# Patient Record
Sex: Male | Born: 1985 | Race: Black or African American | Hispanic: No | Smoking: Current every day smoker
Health system: Southern US, Community
[De-identification: ages and names within clinical notes are randomized; demographics above are authoritative.]

## PROBLEM LIST (undated history)

## (undated) DIAGNOSIS — K219 Gastro-esophageal reflux disease without esophagitis: Secondary | ICD-10-CM

## (undated) DIAGNOSIS — L309 Dermatitis, unspecified: Secondary | ICD-10-CM

## (undated) DIAGNOSIS — M24419 Recurrent dislocation, unspecified shoulder: Secondary | ICD-10-CM

## (undated) DIAGNOSIS — S86019A Strain of unspecified Achilles tendon, initial encounter: Secondary | ICD-10-CM

## (undated) HISTORY — PX: ACHILLES TENDON REPAIR: SUR1153

## (undated) HISTORY — PX: SHOULDER SURGERY: SHX246

---

## 2010-11-08 ENCOUNTER — Emergency Department (HOSPITAL_COMMUNITY): Payer: Self-pay

## 2010-11-08 ENCOUNTER — Emergency Department (HOSPITAL_COMMUNITY)
Admission: EM | Admit: 2010-11-08 | Discharge: 2010-11-08 | Disposition: A | Discharge status: Home / Self Care | Payer: Self-pay | Attending: Emergency Medicine | Admitting: Emergency Medicine

## 2010-11-08 DIAGNOSIS — Y92009 Unspecified place in unspecified non-institutional (private) residence as the place of occurrence of the external cause: Secondary | ICD-10-CM | POA: Insufficient documentation

## 2010-11-08 DIAGNOSIS — M24419 Recurrent dislocation, unspecified shoulder: Secondary | ICD-10-CM | POA: Insufficient documentation

## 2010-11-08 DIAGNOSIS — S46909A Unspecified injury of unspecified muscle, fascia and tendon at shoulder and upper arm level, unspecified arm, initial encounter: Secondary | ICD-10-CM | POA: Insufficient documentation

## 2010-11-08 DIAGNOSIS — X500XXA Overexertion from strenuous movement or load, initial encounter: Secondary | ICD-10-CM | POA: Insufficient documentation

## 2010-11-08 DIAGNOSIS — S4980XA Other specified injuries of shoulder and upper arm, unspecified arm, initial encounter: Secondary | ICD-10-CM | POA: Insufficient documentation

## 2010-11-08 DIAGNOSIS — M25519 Pain in unspecified shoulder: Secondary | ICD-10-CM | POA: Insufficient documentation

## 2010-12-22 ENCOUNTER — Emergency Department (INDEPENDENT_AMBULATORY_CARE_PROVIDER_SITE_OTHER): Payer: Self-pay

## 2010-12-22 ENCOUNTER — Emergency Department (HOSPITAL_COMMUNITY)
Admission: EM | Admit: 2010-12-22 | Discharge: 2010-12-22 | Disposition: A | Discharge status: Home / Self Care | Payer: Self-pay | Source: Home / Self Care | Attending: Emergency Medicine | Admitting: Emergency Medicine

## 2010-12-22 ENCOUNTER — Encounter: Payer: Self-pay | Admitting: Emergency Medicine

## 2010-12-22 DIAGNOSIS — S43006A Unspecified dislocation of unspecified shoulder joint, initial encounter: Secondary | ICD-10-CM

## 2010-12-22 HISTORY — DX: Recurrent dislocation, unspecified shoulder: M24.419

## 2010-12-22 MED ORDER — OXYCODONE-ACETAMINOPHEN 5-325 MG PO TABS: ORAL_TABLET | ORAL | Status: AC

## 2010-12-22 NOTE — ED Notes (Signed)
Shoulder "dislocation" history of the same.  Reports he was reaching down yesterday and "it just happened."   Left shoulder pain, pain across the top of shoulder, axilla, and top of arm, has the "burn" feeling

## 2010-12-22 NOTE — ED Provider Notes (Signed)
History     CSN: [id number] Arrival date & time: 12/22/2010  5:06 PM   First MD Initiated Contact with Patient 12/22/10 1646      Chief Complaint  Patient presents with  . Shoulder Pain    (Consider location/radiation/quality/duration/timing/severity/associated sxs/prior treatment) HPI Comments: Alexis Herrera has had multiple shoulder dislocations and subluxations in the past. He had surgery on the right shoulder in 2003 and the left in 2008. Both of these were in [facility] by a doctor [employer]. Despite his surgery, he continues to have dislocations, mostly of his left shoulder and a left shoulder dislocation yesterday while he was just reaching. He noted deformity and pain. He was able to relocate it himself by dangling it down. The shoulder doesn't quite feel the same since then and has been burning down the arm but no numbness, tingling, or weakness. He now has limited range of motion with pain.  Patient is a 25 y.o. male presenting with shoulder pain.  Shoulder Pain    Past Medical History  Diagnosis Date  . Shoulder dislocation, recurrent     Past Surgical History  Procedure Date  . Shoulder surgery     multiple surgeries on the left and surgery on the right     History reviewed. No pertinent family history.  History  Substance Use Topics  . Smoking status: Current Everyday Smoker  . Smokeless tobacco: Not on file  . Alcohol Use: Yes      Review of Systems  Musculoskeletal: Positive for arthralgias. Negative for myalgias, back pain, joint swelling and gait problem.  Skin: Negative for rash and wound.  Neurological: Negative for weakness and numbness.    Allergies  Review of patient's allergies indicates no known allergies.  Home Medications   Current Outpatient Rx  Name Route Sig Dispense Refill  . ACETAMINOPHEN 325 MG PO TABS Oral Take 650 mg by mouth every 6 (six) hours as needed.      . OXYCODONE-ACETAMINOPHEN 5-325 MG PO TABS  1 to 2 tablets every 6  hours as needed for pain. 20 tablet 0    BP 152/92  Pulse 83  Temp(Src) 97.6 F (36.4 C) (Oral)  Resp 17  SpO2 98%  Physical Exam  Nursing note and vitals reviewed. Constitutional: He is oriented to person, place, and time. He appears well-developed and well-nourished. No distress.  Musculoskeletal: Normal range of motion. He exhibits no edema and no tenderness.       Exam of the shoulder reveals no obvious deformity. There was no pain to palpation. His range of motion was limited with pain. Pulses were full. Muscle strength, sensation, and DTRs were normal.  Neurological: He is alert and oriented to person, place, and time. He has normal strength and normal reflexes. He displays no atrophy. No sensory deficit. He exhibits normal muscle tone.  Skin: Skin is warm and dry. No rash noted. He is not diaphoretic.    ED Course  Procedures (including critical care time)  Labs Reviewed - No data to display Dg Shoulder Left  12/22/2010  *RADIOLOGY REPORT*  Clinical Data: Left shoulder dislocation, pain.  LEFT SHOULDER - 2+ VIEW  Comparison: 11/08/2010  Findings: Postoperative changes are noted with surgical anchors in the region of the glenoid.  There is is a bed and anchor which appears free in the joint space.  No fracture, subluxation or dislocation currently.  IMPRESSION: Single surgical anchor no longer within the bone, likely within the joint space.  Although more difficult to determine  on prior study, this is likely unchanged.  Original Report Authenticated By: Alexis Herrera, M.D.     1. Shoulder dislocation       MDM  It appears that he dislocated his shoulder yesterday and relocated it himself, however the x-ray shows one of the surgical anchors has come loose and is now in the joint space itself which is probably the cause of his current symptoms. Reviewing his x-rays from his emergency room visit of 2 months ago the surgical anchor was in its proper place. Right now he needs to  immobilize it and he has an immobilizer at home which he will wear. I gave him the name of Dr. Dorita Herrera to followup with in a couple days. He'll probably need to have the surgical anchor removed surgically.        Roque Lias, MD 12/22/10 [phone]

## 2010-12-22 NOTE — ED Notes (Signed)
Notified patient xray results are back .  Physician with another patient, but will notify him results back and send him in to patient.  Declined needs.  Continues to be bundled in blanket given to him when triaged.  Significant other in treatment room, denies needs.

## 2011-03-29 ENCOUNTER — Emergency Department (HOSPITAL_COMMUNITY)
Admission: EM | Admit: 2011-03-29 | Discharge: 2011-03-29 | Disposition: A | Discharge status: Home / Self Care | Payer: Self-pay | Attending: Emergency Medicine | Admitting: Emergency Medicine

## 2011-03-29 ENCOUNTER — Emergency Department (HOSPITAL_COMMUNITY): Payer: Self-pay

## 2011-03-29 ENCOUNTER — Encounter (HOSPITAL_COMMUNITY): Payer: Self-pay | Admitting: *Deleted

## 2011-03-29 DIAGNOSIS — S43005A Unspecified dislocation of left shoulder joint, initial encounter: Secondary | ICD-10-CM

## 2011-03-29 DIAGNOSIS — S43006A Unspecified dislocation of unspecified shoulder joint, initial encounter: Secondary | ICD-10-CM | POA: Insufficient documentation

## 2011-03-29 DIAGNOSIS — W19XXXA Unspecified fall, initial encounter: Secondary | ICD-10-CM | POA: Insufficient documentation

## 2011-03-29 MED ORDER — OXYCODONE-ACETAMINOPHEN 5-325 MG PO TABS: 1.0000 | ORAL_TABLET | ORAL | Status: AC | PRN

## 2011-03-29 MED ORDER — ETOMIDATE 2 MG/ML IV SOLN
13.5000 mg | Freq: Once | INTRAVENOUS | Status: AC
  Administered 2011-03-29: 13.5 mg via INTRAVENOUS
  Filled 2011-03-29: qty 10

## 2011-03-29 MED ORDER — HYDROMORPHONE HCL PF 1 MG/ML IJ SOLN
1.0000 mg | Freq: Once | INTRAMUSCULAR | Status: AC
  Administered 2011-03-29: 1 mg via INTRAVENOUS
  Filled 2011-03-29: qty 1

## 2011-03-29 MED ORDER — ONDANSETRON HCL 4 MG/2ML IJ SOLN
4.0000 mg | Freq: Once | INTRAMUSCULAR | Status: AC
  Administered 2011-03-29: 4 mg via INTRAVENOUS
  Filled 2011-03-29: qty 2

## 2011-03-29 MED ORDER — OXYCODONE-ACETAMINOPHEN 5-325 MG PO TABS
2.0000 | ORAL_TABLET | Freq: Once | ORAL | Status: AC
  Administered 2011-03-29: 2 via ORAL
  Filled 2011-03-29: qty 2

## 2011-03-29 MED ORDER — ETOMIDATE 2 MG/ML IV SOLN
INTRAVENOUS | Status: AC | PRN
  Administered 2011-03-29: 13.5 mg via INTRAVENOUS

## 2011-03-29 MED ORDER — SODIUM CHLORIDE 0.9 % IV SOLN
Freq: Once | INTRAVENOUS | Status: AC
  Administered 2011-03-29: 03:00:00 via INTRAVENOUS

## 2011-03-29 NOTE — ED Notes (Signed)
The pt fell earlier tonight and injured his lt shoulder.  He says its dislocated and has been in the past

## 2011-03-29 NOTE — ED Provider Notes (Signed)
History     CSN: [id number]  Arrival date & time 03/29/11  0135   First MD Initiated Contact with Patient 03/29/11 0234      Chief Complaint  Patient presents with  . Left shoulder dislocation     (Consider location/radiation/quality/duration/timing/severity/associated sxs/prior treatment) HPI This is a 26 year old black male with a history of shoulder dislocations. He states he fell about an hour and half ago and dislocated his left shoulder. There is moderate to severe pain associated with this. The pain is worse with attempted movement of the left shoulder. His left upper extremity is otherwise neurovascularly intact. He denies other injury.  Past Medical History  Diagnosis Date  . Shoulder dislocation, recurrent     Past Surgical History  Procedure Date  . Shoulder surgery     multiple surgeries on the left and surgery on the right     History reviewed. No pertinent family history.  History  Substance Use Topics  . Smoking status: Current Everyday Smoker  . Smokeless tobacco: Not on file  . Alcohol Use: Yes      Review of Systems  All other systems reviewed and are negative.    Allergies  Review of patient's allergies indicates no known allergies.  Home Medications  No current outpatient prescriptions on file.  BP 135/84  Pulse 62  Temp(Src) 97.5 F (36.4 C) (Oral)  Resp 15  SpO2 100%  Physical Exam General: Well-developed, well-nourished male in no acute distress; appearance consistent with age of record HENT: normocephalic, atraumatic Eyes: Normal per Neck: supple Heart: regular rate and rhythm Lungs: clear to auscultation bilaterally Abdomen: soft; nondistended Extremities: Anterior dislocation deformity of left shoulder; decreased range of motion left shoulder due to pain; left upper extremity distally neurovascularly intact with intact tendon function Neurologic: Awake, alert and oriented; motor function intact in all extremities and  symmetric; no facial droop Skin: Warm and dry     ED Course  Procedural sedation Date/Time: 03/29/2011 3:04 AM Performed by: Hanley Seamen Authorized by: Hanley Seamen Consent: Verbal consent obtained. Written consent obtained. Risks and benefits: risks, benefits and alternatives were discussed Consent given by: patient Patient identity confirmed: verbally with patient Time out: Immediately prior to procedure a "time out" was called to verify the correct patient, procedure, equipment, support staff and site/side marked as required. Patient sedated: yes Sedatives: etomidate Analgesia: hydromorphone Sedation start date/time: 03/29/2011 3:04 AM Sedation end date/time: 03/29/2011 4:03 AM Vitals: Vital signs were monitored during sedation. Patient tolerance: Patient tolerated the procedure well with no immediate complications.  Reduction of dislocation Date/Time: 03/29/2011 3:06 AM Performed by: Hanley Seamen Authorized by: Hanley Seamen Consent: Verbal consent obtained. Written consent obtained. Risks and benefits: risks, benefits and alternatives were discussed Consent given by: patient Patient identity confirmed: verbally with patient Time out: Immediately prior to procedure a "time out" was called to verify the correct patient, procedure, equipment, support staff and site/side marked as required. Patient sedated: yes Patient tolerance: Patient tolerated the procedure well with no immediate complications. Comments: The shoulder reduced using mild traction countertraction. The left shoulder was then placed in a sling.   (including critical care time)      MDM   Nursing notes and vitals signs, including pulse oximetry, reviewed.  Summary of this visit's results, reviewed by myself:  Imaging Studies: Dg Shoulder Left  03/29/2011  *RADIOLOGY REPORT*  Clinical Data: Status post fall; dislocated left shoulder.  LEFT SHOULDER - 2+ VIEW  Comparison: Left shoulder  radiographs  performed 12/22/2010  Findings: There is anterior-inferior dislocation of the left humeral head, with a prominent associated Hill-Sachs lesion. Apparent irregularity along the inferior glenoid appears chronic in nature.  Postoperative changes are noted about the left shoulder.  No additional fractures are seen.  The visualized portions of the lungs are clear.  No significant soft tissue abnormalities are characterized on radiograph.  IMPRESSION: Anterior-inferior dislocation of the left humeral head, with a prominent associated Hill-Sachs lesion.  Chronic irregularity again noted along the inferior glenoid.  Original Report Authenticated By: Tonia Ghent, M.D.   Dg Shoulder Left Port  03/29/2011  *RADIOLOGY REPORT*  Clinical Data: Status post reduction of left shoulder dislocation.  PORTABLE LEFT SHOULDER - 2+ VIEW  Comparison: Left shoulder radiographs performed earlier today at 02:26 a.m.  Findings: There has been successful reduction of the left humeral head dislocation.  The previously noted Hill-Sachs lesion is less well characterized.  Postoperative change is noted about the left shoulder joint.  The acromioclavicular joint is unremarkable in appearance.  No significant soft tissue abnormalities are seen. The visualized portions of the left lung are clear.  IMPRESSION: Successful reduction of left humeral head dislocation; no new fractures seen.  Original Report Authenticated By: Tonia Ghent, M.D.   4:03 AM Patient is returned to pre-sedation baseline. Left upper extremity remains neurovascularly intact.        Hanley Seamen, MD 03/29/11 [phone]

## 2011-03-29 NOTE — Discharge Instructions (Signed)

## 2011-03-29 NOTE — ED Notes (Signed)
Patient back to baseline.  Sedation has worn off, and the patient has a GCS of 15.

## 2011-03-29 NOTE — ED Notes (Signed)
Patient is AOx4 and comfortable with his discharge instructions.  His significant other is driving him home.

## 2011-04-29 ENCOUNTER — Other Ambulatory Visit: Payer: Self-pay

## 2011-04-29 ENCOUNTER — Other Ambulatory Visit: Payer: Self-pay | Admitting: Orthopedic Surgery

## 2011-04-29 ENCOUNTER — Ambulatory Visit
Admission: RE | Admit: 2011-04-29 | Discharge: 2011-04-29 | Disposition: A | Discharge status: Home / Self Care | Payer: BC Managed Care – PPO | Source: Ambulatory Visit | Attending: Orthopedic Surgery | Admitting: Orthopedic Surgery

## 2011-04-29 DIAGNOSIS — Z01818 Encounter for other preprocedural examination: Secondary | ICD-10-CM

## 2011-09-23 ENCOUNTER — Emergency Department (HOSPITAL_COMMUNITY): Payer: No Typology Code available for payment source

## 2011-09-23 ENCOUNTER — Emergency Department (HOSPITAL_COMMUNITY)
Admission: EM | Admit: 2011-09-23 | Discharge: 2011-09-23 | Disposition: A | Discharge status: Home / Self Care | Payer: No Typology Code available for payment source | Attending: Emergency Medicine | Admitting: Emergency Medicine

## 2011-09-23 DIAGNOSIS — Y92410 Unspecified street and highway as the place of occurrence of the external cause: Secondary | ICD-10-CM | POA: Insufficient documentation

## 2011-09-23 DIAGNOSIS — S139XXA Sprain of joints and ligaments of unspecified parts of neck, initial encounter: Secondary | ICD-10-CM | POA: Insufficient documentation

## 2011-09-23 DIAGNOSIS — M542 Cervicalgia: Secondary | ICD-10-CM | POA: Insufficient documentation

## 2011-09-23 DIAGNOSIS — S161XXA Strain of muscle, fascia and tendon at neck level, initial encounter: Secondary | ICD-10-CM

## 2011-09-23 DIAGNOSIS — R51 Headache: Secondary | ICD-10-CM | POA: Insufficient documentation

## 2011-09-23 DIAGNOSIS — F17200 Nicotine dependence, unspecified, uncomplicated: Secondary | ICD-10-CM | POA: Insufficient documentation

## 2011-09-23 MED ORDER — TRAMADOL HCL 50 MG PO TABS: 50.0000 mg | ORAL_TABLET | Freq: Four times a day (QID) | ORAL | Status: AC | PRN
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
## 2011-09-23 NOTE — ED Notes (Signed)
Patient transported to X-ray 

## 2014-05-20 ENCOUNTER — Emergency Department (HOSPITAL_COMMUNITY): Payer: Self-pay

## 2014-05-20 ENCOUNTER — Encounter (HOSPITAL_COMMUNITY): Payer: Self-pay | Admitting: Emergency Medicine

## 2014-05-20 ENCOUNTER — Emergency Department (HOSPITAL_COMMUNITY)
Admission: EM | Admit: 2014-05-20 | Discharge: 2014-05-20 | Disposition: A | Discharge status: Home / Self Care | Payer: Self-pay | Attending: Emergency Medicine | Admitting: Emergency Medicine

## 2014-05-20 DIAGNOSIS — Y9367 Activity, basketball: Secondary | ICD-10-CM | POA: Insufficient documentation

## 2014-05-20 DIAGNOSIS — S86002A Unspecified injury of left Achilles tendon, initial encounter: Secondary | ICD-10-CM | POA: Insufficient documentation

## 2014-05-20 DIAGNOSIS — M7660 Achilles tendinitis, unspecified leg: Secondary | ICD-10-CM

## 2014-05-20 DIAGNOSIS — Y9289 Other specified places as the place of occurrence of the external cause: Secondary | ICD-10-CM | POA: Insufficient documentation

## 2014-05-20 DIAGNOSIS — Y998 Other external cause status: Secondary | ICD-10-CM | POA: Insufficient documentation

## 2014-05-20 DIAGNOSIS — X58XXXA Exposure to other specified factors, initial encounter: Secondary | ICD-10-CM | POA: Insufficient documentation

## 2014-05-20 DIAGNOSIS — Z720 Tobacco use: Secondary | ICD-10-CM | POA: Insufficient documentation

## 2014-05-20 DIAGNOSIS — S86012A Strain of left Achilles tendon, initial encounter: Secondary | ICD-10-CM

## 2014-05-20 MED ORDER — OXYCODONE-ACETAMINOPHEN 5-325 MG PO TABS
2.0000 | ORAL_TABLET | Freq: Once | ORAL | Status: AC
  Administered 2014-05-20: 2 via ORAL
  Filled 2014-05-20: qty 2

## 2014-05-20 MED ORDER — OXYCODONE-ACETAMINOPHEN 5-325 MG PO TABS: 1.0000 | ORAL_TABLET | Freq: Four times a day (QID) | ORAL | Status: DC | PRN

## 2014-05-20 MED ORDER — NAPROXEN 500 MG PO TABS: 500.0000 mg | ORAL_TABLET | Freq: Two times a day (BID) | ORAL | Status: AC

## 2014-05-20 NOTE — Consult Note (Signed)
PROVIDER NOT IN SYSTEM Chief Complaint: Left achilles tendon rupture History: Alexis Herrera is a 29 y.o. male who presents to the Emergency Department complaining of constant, worsening left lower leg pain that has been present for a few hours since pt was injured while playing basketball. Pt reports a sharp, shooting pain around his achilles area that has been present since pt heart a pop while playing basketball. Pt states that he cannot walk on his foot. Pt reports he can move his toes but cannot move his ankle. Pt reports that he tore his right achilles a few years ago, and states that this pain is identical to that pain.  Past Medical History  Diagnosis Date  . Shoulder dislocation, recurrent     No Known Allergies  No current facility-administered medications on file prior to encounter.   No current outpatient prescriptions on file prior to encounter.    Physical Exam: Filed Vitals:   05/20/14 1609  BP: 143/85  Pulse: 88  Temp: 98.7 F (37.1 C)  Resp: 18  A+O X3 NVI Intact DP/PT pulses Minor bruising over achilles tendon Positive thompson sign  Palpable defect in achillis tendon - mid substance No laceration noted Compartments soft/nt No right LE complaints No SOB/CP Abd soft/nt No knee swelling or pain Sensation to LT intact, moving all toes No active plantar flexion noted   Image: No results found.  A/P:  Patient s/p basketball injury to lef LE.  Felt a pop while playing.  Had similar event on the right side.  Patients clinical exam and MRI consistent with acute achilles tendon rupture.   Plan on splint, pain medication and f/u with my partner Dr Shelle IronBeane Will most likely require surgery for repair. Ok for d/c to home from ER tonight. Splint per ortho tech Crutches NWB on L LE F/u in AM with Dr Shelle IronBeane.

## 2014-05-20 NOTE — ED Notes (Signed)
Per EMS pt felt "pop" in left leg while shooting a basketball at a hoop today, pt states pain feels identical to when he tore his achilles tendon on his right side years ago. No obvious deformity.

## 2014-05-20 NOTE — ED Provider Notes (Signed)
CSN: [id number]     Arrival date & Herrera 05/20/14  1540 History  This chart was scribed for non-physician practitioner Santiago GladHeather Mcclain Shall, PA-C working with Arby BarretteMarcy Pfeiffer, MD by Murriel HopperAlec Bankhead, ED Scribe. This patient was seen in room WTR8/WTR8 Alexis the patient's care was started at 4:49 PM.    Chief Complaint  Patient presents with  . Leg Pain     The history is provided by the patient. No language interpreter was used.    HPI Comments: Alexis Herrera is a 29 y.o. male who presents to the Emergency Department complaining of constant, worsening left lower leg pain over the Achilles tendon that has been present for a few hours since pt was injured while playing basketball. Pt reports a sharp, shooting pain around his achilles area that has been present since pt heart a pop while playing basketball. Pt states that Alexis cannot walk on his foot. Pt reports Alexis can move his toes but cannot move his ankle. Pt reports that Alexis tore his right achilles a few years ago, Alexis states that this pain is identical to that pain.  Alexis has his right Achilles [place] in [place].  Alexis denies numbness or tingling of the foot.  Alexis has not taken anything for pain prior to arrival.   Past Medical History  Diagnosis Date  . Shoulder dislocation, recurrent    Past Surgical History  Procedure Laterality Date  . Shoulder surgery      multiple surgeries on the left Alexis surgery on the right    No family history on file. History  Substance Use Topics  . Smoking status: Current Every Day Smoker -- 1.00 packs/day    Types: Cigarettes  . Smokeless tobacco: Not on file  . Alcohol Use: Yes    Review of Systems  Musculoskeletal: Positive for myalgias Alexis gait problem.  Neurological: Negative for numbness.      Allergies  Review of patient's allergies indicates no known allergies.  Home Medications   Prior to Admission medications   Not on File   BP 143/85 mmHg  Pulse 88  Temp(Src) 98.7 F (37.1 C) (Oral)  Resp  18  Ht 5\' 10"  (1.778 m)  Wt 205 lb (92.987 kg)  BMI 29.41 kg/m2  SpO2 99% Physical Exam  Constitutional: Alexis Herrera, Alexis Herrera, Alexis Herrera. Alexis appears well-developed Alexis well-nourished.  HENT:  Head: Normocephalic Alexis atraumatic.  Cardiovascular: Normal rate, regular rhythm Alexis normal heart sounds.   Pulses:      Dorsalis pedis pulses are 2+ on the right side, Alexis 2+ on the left side.  Pulmonary/Chest: Effort normal Alexis breath sounds normal.  Abdominal: Alexis exhibits no distension.  Musculoskeletal:       Left knee: Alexis exhibits normal range of motion.  Positive thompson's sign on left leg Patient unable to dorsiflex the left foot Palpable deformity of the left Achilles   Neurological: Alexis is alert Alexis oriented to Herrera, Alexis Herrera, Alexis Herrera.  Distal sensation of the left foot intact  Skin: Skin is warm Alexis dry.  Psychiatric: Alexis has a normal mood Alexis affect.  Nursing note Alexis vitals reviewed.   ED Course  Procedures (including critical care Herrera)  DIAGNOSTIC STUDIES: Oxygen Saturation is 99% on room air, normal by my interpretation.    COORDINATION OF CARE: 4:53 PM Discussed treatment plan with pt at bedside Alexis pt agreed to plan.   Labs Review Labs Reviewed - No data to display  Imaging Review Mr Ankle Left  Wo Contrast  05/20/2014   CLINICAL DATA:  Acute onset of pain while playing basketball today. Previous tear of the right Achilles tendon.  EXAM: MRI OF THE LEFT ANKLE WITHOUT CONTRAST  TECHNIQUE: Multiplanar, multisequence MR imaging of the ankle was performed. No intravenous contrast was administered.  COMPARISON:  None.  FINDINGS: TENDONS  Peroneal: Normal.  Posteromedial: Normal.  Anterior: Normal.  Achilles: Complete disruption of the Achilles tendon 4 cm above the calcaneus. The gap in the tendon is approximately 4 cm. The distal tendon is hypertrophied Alexis degenerated. There is hemorrhage into the adjacent soft tissues.  Plantar Fascia: Normal.  LIGAMENTS  Lateral:  There is no visible intact anterior talofibular ligament or calcaneofibular ligament, probably representing chronic tears. The posterior talofibular ligament is intact.  Medial: Normal.  CARTILAGE  Ankle Joint: Normal.  Subtalar Joints/Sinus Tarsi: Normal.  Bones: No significant abnormality.  IMPRESSION: 1. 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 2. No visible anterior talofibular ligament or calcaneofibular ligament. Does the patient have ligamentous laxity laterally? This is not felt to be acute.   Electronically Signed   By: Francene BoyersJames  Maxwell M.D.   On: 05/20/2014 18:35     EKG Interpretation None     Discussed with Dr. Shon BatonBrooks with Orthopedics.  Alexis recommends getting a MRI to rule out Achilles rupture. MDM   Final diagnoses:  Achilles tendon pain   Patient presents today with an injury to the left Achilles tendon that occurred while playing basketball jpta.  Patient reports that Alexis has been unable to ambulate.  Patient with positive Thompson's sign Alexis obvious palpable deformity of the Achilles.  Alexis is neurovascularly intact.  Patient discussed with Dr. Shon BatonBrooks with Orthopedics.  Alexis recommended MRI.  MRI today showing complete transection of the Achilles Tendon.  Patient seen by Dr. Shon BatonBrooks in the ED.  Patient given posterior splint Alexis crutches.  Patient has follow up with Orthopedics tomorrow.  Patient discharged home with pain medications.  Stable for discharge.  Return precautions given.   Santiago GladHeather Jayko Voorhees, PA-C 05/20/14 2018  Raeford RazorStephen Kohut, MD 05/22/14 [phone]

## 2014-05-20 NOTE — Discharge Instructions (Signed)
Do not put weight on left leg Crutches Pain medications per ER instructions Ice and elevate the left leg tonight Call first thing in the morning for appointment time

## 2014-05-20 NOTE — ED Notes (Signed)
Pt presents to ED via EMS C/O L foot pain. Pt states that he was playing basketball around 3pm and felt something pop. Pt reports pain as 6/10 and states that it is sharp and shooting. Also reports that pain is getting worse.

## 2014-05-27 NOTE — Progress Notes (Signed)
Please put orders in Epic surgery 05-31-14 pre op 05-30-14 Thanks

## 2014-05-28 ENCOUNTER — Ambulatory Visit: Payer: Self-pay | Admitting: Orthopedic Surgery

## 2014-05-28 NOTE — H&P (Signed)
Alexis Herrera is an 29 y.o. male.   Chief Complaint: left ankle pain HPI: The patient is a 29 year old male who presents with ankle complaints. The patient reports left ankle (achilles) symptoms including: pain which began 3 day(s) ago following a specific injury. The injury occurred while the patient was playing basketball. The patient describes their symptoms as mild (to severe). Current treatment includes posterior splint, crutches (non-weight bearing), nonsteroidal anti-inflammatory drugs (Naproxen) and opioid analgesics (Percocet). Prior to being seen today the patient was previously evaluated in the emergency room. Previous work-up for this problem has included ankle MRI. Past treatment for this problem has included posterior splint, crutches (non-weight bearing), nonsteroidal anti-inflammatory drugs (Naproxen) and opioid analgesics. He has had the other side repaired two-three years ago, was the same etiology.  Past Medical History  Diagnosis Date  . Shoulder dislocation, recurrent     Past Surgical History  Procedure Laterality Date  . Shoulder surgery      multiple surgeries on the left and surgery on the right     No family history on file. Social History:  reports that he has been smoking Cigarettes.  He has been smoking about 1.00 pack per day. He does not have any smokeless tobacco history on file. He reports that he drinks alcohol. His drug history is not on file.  Allergies: No Known Allergies   (Not in a hospital admission)  No results found for this or any previous visit (from the past 48 hour(s)). No results found.  Review of Systems  Constitutional: Negative.   HENT: Negative.   Eyes: Negative.   Respiratory: Negative.   Cardiovascular: Negative.   Gastrointestinal: Negative.   Genitourinary: Negative.   Musculoskeletal: Positive for joint pain.  Skin: Negative.   Neurological: Negative.   Psychiatric/Behavioral: Negative.     There were no vitals taken  for this visit. Physical Exam  Constitutional: He is oriented to person, place, and time. He appears well-developed and well-nourished.  HENT:  Head: Normocephalic and atraumatic.  Eyes: Conjunctivae and EOM are normal. Pupils are equal, round, and reactive to light.  Neck: Normal range of motion. Neck supple.  Cardiovascular: Normal rate and regular rhythm.   Respiratory: Effort normal and breath sounds normal.  GI: Soft. Bowel sounds are normal.  Musculoskeletal:  On exam, he has got a gap in the Achilles tendon, near the proximal aspect of the tendon. Not at the insertion. There is no evidence of DVT. He is neurovascularly intact. Knee exam and hip exam is unremarkable  Neurological: He is alert and oriented to person, place, and time. He has normal reflexes.  Skin: Skin is warm and dry.  Psychiatric: He has a normal mood and affect.    His outside MR demonstrates complete tear of the Achilles tendon, 4 cm above the calcaneus. Gap in the tendon is approximately 4 cm.  X-rays demonstrate no fracture.  Assessment/Plan Acute left achilles tendon rupture  We discussed options conservative versus surgical and he agreed to proceeding with surgical that may require a patch graft. We discussed risks and benefits, bleeding, infection, recurrent tear, suboptimal range of motion, DVT, PE, neurologic damage and the time to union. This may take a considerable time to heal. He does not have any insurance. [facility]. In the interim, aspirin for DVT prophylaxis, tobacco cessation indicating an affect on healing.  Plan repair left achilles tendon, possible patch graft  Anarie Kalish M. PA-C for Dr.  Beane 05/28/2014, 8:05 AM    

## 2014-05-29 NOTE — Patient Instructions (Addendum)
YOUR PROCEDURE IS SCHEDULED ON :  05/31/14  REPORT TO [facility] MAIN ENTRANCE FOLLOW SIGNS TO SHORT STAY CENTER AT :  11:30 AM  CALL THIS NUMBER IF YOU HAVE PROBLEMS THE MORNING OF SURGERY [phone]  REMEMBER:  DO NOT EAT FOOD  AFTER MIDNIGHT MAY HAVE CLEAR LIQUIDS UNTIL 7:30 AM  CLEAR LIQUID DIET Foods Allowed                                                                     Foods Excluded  Coffee and tea, regular and decaf                             liquids that you cannot  Plain Jell-O in any flavor                                             see through such as: Fruit ices (not with fruit pulp)                                     milk, soups, orange juice  Iced Popsicles                                                All solid food Carbonated beverages, regular and diet                                    Cranberry, grape and apple juices Sports drinks like Gatorade Lightly seasoned clear broth or consume(fat free) Sugar, honey syrup  _____________________________________________________________________  TAKE THESE MEDICINES THE MORNING OF SURGERY:  PERCOCET IF NEEDED  YOU MAY NOT HAVE ANY METAL ON YOUR BODY INCLUDING HAIR PINS AND PIERCING'S. DO NOT WEAR JEWELRY, MAKEUP, LOTIONS, POWDERS OR PERFUMES. DO NOT WEAR NAIL POLISH. DO NOT SHAVE 48 HRS PRIOR TO SURGERY. MEN MAY SHAVE FACE AND NECK.  DO NOT BRING VALUABLES TO HOSPITAL. Alexis Herrera IS NOT RESPONSIBLE FOR VALUABLES.  CONTACTS, DENTURES OR PARTIALS MAY NOT BE WORN TO SURGERY. LEAVE SUITCASE IN CAR. CAN BE BROUGHT TO ROOM AFTER SURGERY.  PATIENTS DISCHARGED THE DAY OF SURGERY WILL NOT BE ALLOWED TO DRIVE HOME.  PLEASE READ OVER THE FOLLOWING INSTRUCTION SHEETS _________________________________________________________________________________                                          [facility] - PREPARING FOR SURGERY  Before surgery, you can play an important role.  Because skin is not  sterile, your skin needs to be as free of germs as possible.  You can reduce the number of germs on your skin by washing with CHG (chlorahexidine gluconate) soap before surgery.  CHG is  an antiseptic cleaner which kills germs and bonds with the skin to continue killing germs even after washing. Please DO NOT use if you have an allergy to CHG or antibacterial soaps.  If your skin becomes reddened/irritated stop using the CHG and inform your nurse when you arrive at Short Stay. Do not shave (including legs and underarms) for at least 48 hours prior to the first CHG shower.  You may shave your face. Please follow these instructions carefully:   1.  Shower with CHG Soap the night before surgery and the  morning of Surgery.   2.  If you choose to wash your hair, wash your hair first as usual with your  normal  Shampoo.   3.  After you shampoo, rinse your hair and body thoroughly to remove the  shampoo.                                         4.  Use CHG as you would any other liquid soap.  You can apply chg directly  to the skin and wash . Gently wash with scrungie or clean wascloth    5.  Apply the CHG Soap to your body ONLY FROM THE NECK DOWN.   Do not use on open                           Wound or open sores. Avoid contact with eyes, ears mouth and genitals (private parts).                        Genitals (private parts) with your normal soap.              6.  Wash thoroughly, paying special attention to the area where your surgery  will be performed.   7.  Thoroughly rinse your body with warm water from the neck down.   8.  DO NOT shower/wash with your normal soap after using and rinsing off  the CHG Soap .                9.  Pat yourself dry with a clean towel.             10.  Wear clean night clothes to bed after shower             11.  Place clean sheets on your bed the night of your first shower and do not  sleep with pets.  Day of Surgery : Do not apply any lotions/deodorants the  morning of surgery.  Please wear clean clothes to the hospital/surgery center.  FAILURE TO FOLLOW THESE INSTRUCTIONS MAY RESULT IN THE CANCELLATION OF YOUR SURGERY    PATIENT SIGNATURE_________________________________  ______________________________________________________________________     Alexis MireIncentive Spirometer  An incentive spirometer is a tool that can help keep your lungs clear and active. This tool measures how well you are filling your lungs with each breath. Taking long deep breaths may help reverse or decrease the chance of developing breathing (pulmonary) problems (especially infection) following:  A long period of time when you are unable to move or be active. BEFORE THE PROCEDURE   If the spirometer includes an indicator to show your best effort, your nurse or respiratory therapist will set it to a desired goal.  If possible, sit up straight or lean  slightly forward. Try not to slouch.  Hold the incentive spirometer in an upright position. INSTRUCTIONS FOR USE  1. Sit on the edge of your bed if possible, or sit up as far as you can in bed or on a chair. 2. Hold the incentive spirometer in an upright position. 3. Breathe out normally. 4. Place the mouthpiece in your mouth and seal your lips tightly around it. 5. Breathe in slowly and as deeply as possible, raising the piston or the ball toward the top of the column. 6. Hold your breath for 3-5 seconds or for as long as possible. Allow the piston or ball to fall to the bottom of the column. 7. Remove the mouthpiece from your mouth and breathe out normally. 8. Rest for a few seconds and repeat Steps 1 through 7 at least 10 times every 1-2 hours when you are awake. Take your time and take a few normal breaths between deep breaths. 9. The spirometer may include an indicator to show your best effort. Use the indicator as a goal to work toward during each repetition. 10. After each set of 10 deep breaths, practice coughing to  be sure your lungs are clear. If you have an incision (the cut made at the time of surgery), support your incision when coughing by placing a pillow or rolled up towels firmly against it. Once you are able to get out of bed, walk around indoors and cough well. You may stop using the incentive spirometer when instructed by your caregiver.  RISKS AND COMPLICATIONS  Take your time so you do not get dizzy or light-headed.  If you are in pain, you may need to take or ask for pain medication before doing incentive spirometry. It is harder to take a deep breath if you are having pain. AFTER USE  Rest and breathe slowly and easily.  It can be helpful to keep track of a log of your progress. Your caregiver can provide you with a simple table to help with this. If you are using the spirometer at home, follow these instructions: SEEK MEDICAL CARE IF:   You are having difficultly using the spirometer.  You have trouble using the spirometer as often as instructed.  Your pain medication is not giving enough relief while using the spirometer.  You develop fever of 100.5 F (38.1 C) or higher. SEEK IMMEDIATE MEDICAL CARE IF:   You cough up bloody sputum that had not been present before.  You develop fever of 102 F (38.9 C) or greater.  You develop worsening pain at or near the incision site. MAKE SURE YOU:   Understand these instructions.  Will watch your condition.  Will get help right away if you are not doing well or get worse. Document Released: 05/10/2006 Document Revised: 03/22/2011 Document Reviewed: 07/11/2006 [facility] Patient Information 2014 [place], [place].   ________________________________________________________________________

## 2014-05-30 ENCOUNTER — Encounter (HOSPITAL_COMMUNITY)
Admission: RE | Admit: 2014-05-30 | Discharge: 2014-05-30 | Disposition: A | Discharge status: Home / Self Care | Payer: Self-pay | Source: Ambulatory Visit | Attending: Specialist | Admitting: Specialist

## 2014-05-30 ENCOUNTER — Encounter (HOSPITAL_COMMUNITY): Payer: Self-pay

## 2014-05-30 DIAGNOSIS — Z01812 Encounter for preprocedural laboratory examination: Secondary | ICD-10-CM | POA: Insufficient documentation

## 2014-05-30 HISTORY — DX: Strain of unspecified achilles tendon, initial encounter: S86.019A

## 2014-05-30 HISTORY — DX: Gastro-esophageal reflux disease without esophagitis: K21.9

## 2014-05-30 HISTORY — DX: Dermatitis, unspecified: L30.9

## 2014-05-30 LAB — CBC
HCT: 46.1 % (ref 39.0–52.0)
HEMOGLOBIN: 15.7 g/dL (ref 13.0–17.0)
MCH: 30.2 pg (ref 26.0–34.0)
MCHC: 34.1 g/dL (ref 30.0–36.0)
MCV: 88.7 fL (ref 78.0–100.0)
Platelets: 316 10*3/uL (ref 150–400)
RBC: 5.2 MIL/uL (ref 4.22–5.81)
RDW: 13.1 % (ref 11.5–15.5)
WBC: 10 10*3/uL (ref 4.0–10.5)

## 2014-05-31 ENCOUNTER — Encounter (HOSPITAL_COMMUNITY): Payer: Self-pay | Admitting: *Deleted

## 2014-05-31 ENCOUNTER — Ambulatory Visit (HOSPITAL_COMMUNITY): Payer: Self-pay | Admitting: Anesthesiology

## 2014-05-31 ENCOUNTER — Encounter (HOSPITAL_COMMUNITY)
Admission: RE | Disposition: A | Discharge status: Home / Self Care | Payer: Self-pay | Source: Ambulatory Visit | Attending: Specialist

## 2014-05-31 ENCOUNTER — Ambulatory Visit (HOSPITAL_COMMUNITY)
Admission: RE | Admit: 2014-05-31 | Discharge: 2014-06-01 | Disposition: A | Discharge status: Home / Self Care | Payer: Self-pay | Source: Ambulatory Visit | Attending: Specialist | Admitting: Specialist

## 2014-05-31 DIAGNOSIS — F17210 Nicotine dependence, cigarettes, uncomplicated: Secondary | ICD-10-CM | POA: Insufficient documentation

## 2014-05-31 DIAGNOSIS — Y9367 Activity, basketball: Secondary | ICD-10-CM | POA: Insufficient documentation

## 2014-05-31 DIAGNOSIS — S86019A Strain of unspecified Achilles tendon, initial encounter: Secondary | ICD-10-CM | POA: Diagnosis present

## 2014-05-31 DIAGNOSIS — K219 Gastro-esophageal reflux disease without esophagitis: Secondary | ICD-10-CM | POA: Insufficient documentation

## 2014-05-31 DIAGNOSIS — X58XXXA Exposure to other specified factors, initial encounter: Secondary | ICD-10-CM | POA: Insufficient documentation

## 2014-05-31 DIAGNOSIS — S86012A Strain of left Achilles tendon, initial encounter: Secondary | ICD-10-CM | POA: Insufficient documentation

## 2014-05-31 HISTORY — PX: ACHILLES TENDON SURGERY: SHX542

## 2014-05-31 SURGERY — REPAIR, TENDON, ACHILLES
Anesthesia: General | Site: Ankle | Laterality: Left

## 2014-05-31 MED ORDER — HYDROMORPHONE HCL 1 MG/ML IJ SOLN
1.0000 mg | INTRAMUSCULAR | Status: DC | PRN
  Administered 2014-05-31 – 2014-06-01 (×4): 1.5 mg via INTRAVENOUS
  Filled 2014-05-31 (×4): qty 2

## 2014-05-31 MED ORDER — HYDROMORPHONE HCL 1 MG/ML IJ SOLN
INTRAMUSCULAR | Status: DC | PRN
  Administered 2014-05-31: 0.5 mg via INTRAVENOUS

## 2014-05-31 MED ORDER — HYDROMORPHONE HCL 1 MG/ML IJ SOLN
INTRAMUSCULAR | Status: AC
  Filled 2014-05-31: qty 1

## 2014-05-31 MED ORDER — HYDROMORPHONE HCL 1 MG/ML IJ SOLN
0.2500 mg | INTRAMUSCULAR | Status: DC | PRN
  Administered 2014-05-31 (×3): 0.25 mg via INTRAVENOUS
  Administered 2014-05-31: 0.5 mg via INTRAVENOUS
  Administered 2014-05-31: 0.25 mg via INTRAVENOUS
  Administered 2014-05-31: 0.5 mg via INTRAVENOUS

## 2014-05-31 MED ORDER — FENTANYL CITRATE (PF) 250 MCG/5ML IJ SOLN
INTRAMUSCULAR | Status: AC
  Filled 2014-05-31: qty 5

## 2014-05-31 MED ORDER — ONDANSETRON HCL 4 MG/2ML IJ SOLN
INTRAMUSCULAR | Status: AC
  Filled 2014-05-31: qty 2

## 2014-05-31 MED ORDER — ONDANSETRON HCL 4 MG/2ML IJ SOLN
INTRAMUSCULAR | Status: DC | PRN
  Administered 2014-05-31: 4 mg via INTRAVENOUS

## 2014-05-31 MED ORDER — LORAZEPAM 1 MG PO TABS
1.0000 mg | ORAL_TABLET | Freq: Four times a day (QID) | ORAL | Status: DC | PRN
  Administered 2014-05-31: 1 mg via ORAL
  Filled 2014-05-31: qty 1

## 2014-05-31 MED ORDER — OXYCODONE HCL 5 MG PO TABS
5.0000 mg | ORAL_TABLET | ORAL | Status: DC | PRN
  Administered 2014-05-31 – 2014-06-01 (×4): 10 mg via ORAL
  Filled 2014-05-31 (×5): qty 2

## 2014-05-31 MED ORDER — ONDANSETRON HCL 4 MG/2ML IJ SOLN
4.0000 mg | Freq: Four times a day (QID) | INTRAMUSCULAR | Status: DC | PRN
  Administered 2014-05-31: 4 mg via INTRAVENOUS
  Filled 2014-05-31: qty 2

## 2014-05-31 MED ORDER — KETOROLAC TROMETHAMINE 15 MG/ML IJ SOLN
15.0000 mg | Freq: Four times a day (QID) | INTRAMUSCULAR | Status: DC
  Administered 2014-05-31: 15 mg via INTRAVENOUS

## 2014-05-31 MED ORDER — FENTANYL CITRATE (PF) 100 MCG/2ML IJ SOLN
INTRAMUSCULAR | Status: AC
  Filled 2014-05-31: qty 2

## 2014-05-31 MED ORDER — EPHEDRINE SULFATE 50 MG/ML IJ SOLN
INTRAMUSCULAR | Status: DC | PRN
  Administered 2014-05-31: 5 mg via INTRAVENOUS

## 2014-05-31 MED ORDER — POLYMYXIN B SULFATE 500000 UNITS IJ SOLR
INTRAMUSCULAR | Status: DC | PRN
  Administered 2014-05-31: 500 mL

## 2014-05-31 MED ORDER — BUPIVACAINE-EPINEPHRINE 0.25% -1:200000 IJ SOLN
INTRAMUSCULAR | Status: DC | PRN
  Administered 2014-05-31: 3 mL

## 2014-05-31 MED ORDER — KETOROLAC TROMETHAMINE 15 MG/ML IJ SOLN: 15.0000 mg | Freq: Once | INTRAMUSCULAR | Status: DC

## 2014-05-31 MED ORDER — MIDAZOLAM HCL 5 MG/5ML IJ SOLN
INTRAMUSCULAR | Status: DC | PRN
  Administered 2014-05-31 (×2): 1 mg via INTRAVENOUS

## 2014-05-31 MED ORDER — CEFAZOLIN SODIUM-DEXTROSE 2-3 GM-% IV SOLR
2.0000 g | INTRAVENOUS | Status: AC
  Administered 2014-05-31: 2 g via INTRAVENOUS

## 2014-05-31 MED ORDER — LACTATED RINGERS IV SOLN
INTRAVENOUS | Status: DC
  Administered 2014-05-31 (×3): via INTRAVENOUS

## 2014-05-31 MED ORDER — GLYCOPYRROLATE 0.2 MG/ML IJ SOLN
INTRAMUSCULAR | Status: AC
  Filled 2014-05-31: qty 4

## 2014-05-31 MED ORDER — HYDROMORPHONE HCL 2 MG/ML IJ SOLN
INTRAMUSCULAR | Status: AC
  Filled 2014-05-31: qty 1

## 2014-05-31 MED ORDER — BUPIVACAINE HCL (PF) 0.25 % IJ SOLN
INTRAMUSCULAR | Status: AC
  Filled 2014-05-31: qty 30

## 2014-05-31 MED ORDER — ENOXAPARIN SODIUM 40 MG/0.4ML ~~LOC~~ SOLN
40.0000 mg | SUBCUTANEOUS | Status: DC
  Administered 2014-06-01: 40 mg via SUBCUTANEOUS
  Filled 2014-05-31 (×2): qty 0.4

## 2014-05-31 MED ORDER — NEOSTIGMINE METHYLSULFATE 10 MG/10ML IV SOLN
INTRAVENOUS | Status: DC | PRN
  Administered 2014-05-31: 5 mg via INTRAVENOUS

## 2014-05-31 MED ORDER — ROCURONIUM BROMIDE 100 MG/10ML IV SOLN
INTRAVENOUS | Status: DC | PRN
  Administered 2014-05-31: 50 mg via INTRAVENOUS

## 2014-05-31 MED ORDER — LIDOCAINE HCL (CARDIAC) 20 MG/ML IV SOLN
INTRAVENOUS | Status: DC | PRN
  Administered 2014-05-31: 60 mg via INTRAVENOUS

## 2014-05-31 MED ORDER — CEFAZOLIN SODIUM-DEXTROSE 2-3 GM-% IV SOLR
2.0000 g | Freq: Four times a day (QID) | INTRAVENOUS | Status: AC
  Administered 2014-05-31 – 2014-06-01 (×2): 2 g via INTRAVENOUS
  Filled 2014-05-31 (×2): qty 50

## 2014-05-31 MED ORDER — DOCUSATE SODIUM 100 MG PO CAPS: 100.0000 mg | ORAL_CAPSULE | Freq: Two times a day (BID) | ORAL | Status: AC

## 2014-05-31 MED ORDER — ACETAMINOPHEN 325 MG PO TABS
650.0000 mg | ORAL_TABLET | Freq: Four times a day (QID) | ORAL | Status: DC | PRN
  Administered 2014-05-31 – 2014-06-01 (×2): 650 mg via ORAL
  Filled 2014-05-31 (×2): qty 2

## 2014-05-31 MED ORDER — NEOSTIGMINE METHYLSULFATE 10 MG/10ML IV SOLN
INTRAVENOUS | Status: AC
  Filled 2014-05-31: qty 1

## 2014-05-31 MED ORDER — METOCLOPRAMIDE HCL 10 MG PO TABS: 5.0000 mg | ORAL_TABLET | Freq: Three times a day (TID) | ORAL | Status: DC | PRN

## 2014-05-31 MED ORDER — KETOROLAC TROMETHAMINE 15 MG/ML IJ SOLN
INTRAMUSCULAR | Status: AC
  Administered 2014-05-31: 15 mg
  Filled 2014-05-31: qty 1

## 2014-05-31 MED ORDER — FENTANYL CITRATE (PF) 100 MCG/2ML IJ SOLN
25.0000 ug | INTRAMUSCULAR | Status: DC | PRN
  Administered 2014-05-31 (×4): 50 ug via INTRAVENOUS

## 2014-05-31 MED ORDER — METHOCARBAMOL 1000 MG/10ML IJ SOLN
500.0000 mg | Freq: Once | INTRAVENOUS | Status: AC
  Administered 2014-05-31: 500 mg via INTRAVENOUS
  Filled 2014-05-31: qty 5

## 2014-05-31 MED ORDER — OXYCODONE-ACETAMINOPHEN 5-325 MG PO TABS: 1.0000 | ORAL_TABLET | ORAL | Status: AC | PRN

## 2014-05-31 MED ORDER — METHOCARBAMOL 500 MG PO TABS
500.0000 mg | ORAL_TABLET | Freq: Four times a day (QID) | ORAL | Status: DC | PRN
  Administered 2014-06-01 (×2): 500 mg via ORAL
  Filled 2014-05-31 (×2): qty 1

## 2014-05-31 MED ORDER — FENTANYL CITRATE (PF) 100 MCG/2ML IJ SOLN
INTRAMUSCULAR | Status: DC | PRN
  Administered 2014-05-31 (×5): 50 ug via INTRAVENOUS

## 2014-05-31 MED ORDER — PROPOFOL 10 MG/ML IV BOLUS
INTRAVENOUS | Status: AC
  Filled 2014-05-31: qty 20

## 2014-05-31 MED ORDER — ROCURONIUM BROMIDE 100 MG/10ML IV SOLN
INTRAVENOUS | Status: AC
  Filled 2014-05-31: qty 1

## 2014-05-31 MED ORDER — SODIUM CHLORIDE 0.9 % IR SOLN
Status: AC
  Filled 2014-05-31: qty 1

## 2014-05-31 MED ORDER — ACETAMINOPHEN 650 MG RE SUPP: 650.0000 mg | Freq: Four times a day (QID) | RECTAL | Status: DC | PRN

## 2014-05-31 MED ORDER — KETOROLAC TROMETHAMINE 15 MG/ML IJ SOLN
INTRAMUSCULAR | Status: AC
  Filled 2014-05-31: qty 1

## 2014-05-31 MED ORDER — DOCUSATE SODIUM 100 MG PO CAPS
100.0000 mg | ORAL_CAPSULE | Freq: Two times a day (BID) | ORAL | Status: DC
  Administered 2014-05-31: 100 mg via ORAL

## 2014-05-31 MED ORDER — GLYCOPYRROLATE 0.2 MG/ML IJ SOLN
INTRAMUSCULAR | Status: DC | PRN
  Administered 2014-05-31: .8 mg via INTRAVENOUS

## 2014-05-31 MED ORDER — ONDANSETRON HCL 4 MG PO TABS: 4.0000 mg | ORAL_TABLET | Freq: Four times a day (QID) | ORAL | Status: DC | PRN

## 2014-05-31 MED ORDER — BUPIVACAINE HCL (PF) 0.5 % IJ SOLN
INTRAMUSCULAR | Status: AC
  Filled 2014-05-31: qty 30

## 2014-05-31 MED ORDER — SENNOSIDES-DOCUSATE SODIUM 8.6-50 MG PO TABS: 1.0000 | ORAL_TABLET | Freq: Every evening | ORAL | Status: DC | PRN

## 2014-05-31 MED ORDER — PROPOFOL 10 MG/ML IV BOLUS
INTRAVENOUS | Status: DC | PRN
  Administered 2014-05-31: 200 mg via INTRAVENOUS

## 2014-05-31 MED ORDER — LIDOCAINE HCL (CARDIAC) 20 MG/ML IV SOLN
INTRAVENOUS | Status: AC
  Filled 2014-05-31: qty 5

## 2014-05-31 MED ORDER — METHOCARBAMOL 500 MG PO TABS: 500.0000 mg | ORAL_TABLET | Freq: Four times a day (QID) | ORAL | Status: AC

## 2014-05-31 MED ORDER — MIDAZOLAM HCL 2 MG/2ML IJ SOLN
INTRAMUSCULAR | Status: AC
  Filled 2014-05-31: qty 2

## 2014-05-31 MED ORDER — METOCLOPRAMIDE HCL 5 MG/ML IJ SOLN
5.0000 mg | Freq: Three times a day (TID) | INTRAMUSCULAR | Status: DC | PRN
  Administered 2014-06-01: 10 mg via INTRAVENOUS
  Filled 2014-05-31: qty 2

## 2014-05-31 MED ORDER — CEFAZOLIN SODIUM-DEXTROSE 2-3 GM-% IV SOLR
INTRAVENOUS | Status: AC
  Filled 2014-05-31: qty 50

## 2014-05-31 MED ORDER — KCL IN DEXTROSE-NACL 20-5-0.45 MEQ/L-%-% IV SOLN
INTRAVENOUS | Status: DC
  Administered 2014-05-31: 19:00:00 via INTRAVENOUS
  Filled 2014-05-31 (×2): qty 1000

## 2014-05-31 MED ORDER — KETOROLAC TROMETHAMINE 15 MG/ML IJ SOLN
30.0000 mg | Freq: Four times a day (QID) | INTRAMUSCULAR | Status: DC
  Administered 2014-06-01 (×2): 30 mg via INTRAVENOUS
  Filled 2014-05-31 (×4): qty 2

## 2014-05-31 MED ORDER — LACTATED RINGERS IV SOLN: INTRAVENOUS | Status: DC

## 2014-05-31 MED ORDER — HYDROMORPHONE HCL 1 MG/ML IJ SOLN
1.0000 mg | INTRAMUSCULAR | Status: DC | PRN
  Administered 2014-05-31: 1 mg via INTRAVENOUS
  Filled 2014-05-31: qty 1

## 2014-05-31 SURGICAL SUPPLY — 52 items
BAG ZIPLOCK 12X15 (MISCELLANEOUS) IMPLANT
BANDAGE ELASTIC 4 VELCRO ST LF (GAUZE/BANDAGES/DRESSINGS) ×3 IMPLANT
BANDAGE ELASTIC 6 VELCRO ST LF (GAUZE/BANDAGES/DRESSINGS) ×3 IMPLANT
BIT DRILL 2.8X128 (BIT) IMPLANT
BIT DRILL 2.8X128MM (BIT)
CLOTH 2% CHLOROHEXIDINE 3PK (PERSONAL CARE ITEMS) ×3 IMPLANT
CUFF TOURN SGL QUICK 34 (TOURNIQUET CUFF) ×2
CUFF TRNQT CYL 34X4X40X1 (TOURNIQUET CUFF) ×1 IMPLANT
DRAPE U-SHAPE 47X51 STRL (DRAPES) ×3 IMPLANT
DRSG EMULSION OIL 3X3 NADH (GAUZE/BANDAGES/DRESSINGS) ×3 IMPLANT
DURAPREP 26ML APPLICATOR (WOUND CARE) ×6 IMPLANT
ELECT REM PT RETURN 9FT ADLT (ELECTROSURGICAL) ×3
ELECTRODE REM PT RTRN 9FT ADLT (ELECTROSURGICAL) ×1 IMPLANT
GAUZE SPONGE 4X4 12PLY STRL (GAUZE/BANDAGES/DRESSINGS) ×3 IMPLANT
GLOVE BIO SURGEON STRL SZ 6.5 (GLOVE) ×2 IMPLANT
GLOVE BIO SURGEONS STRL SZ 6.5 (GLOVE) ×1
GLOVE BIOGEL PI IND STRL 6.5 (GLOVE) ×1 IMPLANT
GLOVE BIOGEL PI IND STRL 7.0 (GLOVE) ×1 IMPLANT
GLOVE BIOGEL PI IND STRL 7.5 (GLOVE) ×1 IMPLANT
GLOVE BIOGEL PI INDICATOR 6.5 (GLOVE) ×2
GLOVE BIOGEL PI INDICATOR 7.0 (GLOVE) ×2
GLOVE BIOGEL PI INDICATOR 7.5 (GLOVE) ×2
GLOVE SURG SS PI 7.5 STRL IVOR (GLOVE) ×3 IMPLANT
GLOVE SURG SS PI 8.0 STRL IVOR (GLOVE) ×6 IMPLANT
GOWN STRL REUS W/TWL XL LVL3 (GOWN DISPOSABLE) ×9 IMPLANT
KIT BASIN OR (CUSTOM PROCEDURE TRAY) ×3 IMPLANT
LOOP VESSEL MAXI BLUE (MISCELLANEOUS) ×3 IMPLANT
MANIFOLD NEPTUNE II (INSTRUMENTS) ×3 IMPLANT
NDL SAFETY ECLIPSE 18X1.5 (NEEDLE) ×1 IMPLANT
NEEDLE HYPO 18GX1.5 SHARP (NEEDLE) ×2
NEEDLE MA TROC 1/2 (NEEDLE) IMPLANT
PACK ORTHO EXTREMITY (CUSTOM PROCEDURE TRAY) ×3 IMPLANT
PAD CAST 4YDX4 CTTN HI CHSV (CAST SUPPLIES) IMPLANT
PADDING CAST ABS 4INX4YD NS (CAST SUPPLIES) ×2
PADDING CAST ABS 6INX4YD NS (CAST SUPPLIES) ×2
PADDING CAST ABS COTTON 4X4 ST (CAST SUPPLIES) ×1 IMPLANT
PADDING CAST ABS COTTON 6X4 NS (CAST SUPPLIES) ×1 IMPLANT
PADDING CAST COTTON 4X4 STRL (CAST SUPPLIES)
PADDING CAST SYNTHETIC 4 (CAST SUPPLIES)
PADDING CAST SYNTHETIC 4X4 STR (CAST SUPPLIES) IMPLANT
POSITIONER SURGICAL ARM (MISCELLANEOUS) ×3 IMPLANT
SCOTCHCAST PLUS 4X4 WHITE (CAST SUPPLIES) ×3 IMPLANT
SPLINT FIBERGLASS 4X30 (CAST SUPPLIES) IMPLANT
SPONGE LAP 4X18 X RAY DECT (DISPOSABLE) IMPLANT
STAPLER VISISTAT (STAPLE) ×3 IMPLANT
SUT FIBERWIRE #2 38 T-5 BLUE (SUTURE) ×12
SUT VIC AB 1 CT1 27 (SUTURE) ×2
SUT VIC AB 1 CT1 27XBRD ANTBC (SUTURE) ×1 IMPLANT
SUT VIC AB 2-0 CT2 27 (SUTURE) ×3 IMPLANT
SUT VICRYL 0 27 CT2 27 ABS (SUTURE) ×3 IMPLANT
SUTURE FIBERWR #2 38 T-5 BLUE (SUTURE) ×4 IMPLANT
SYR CONTROL 10ML LL (SYRINGE) ×3 IMPLANT

## 2014-05-31 NOTE — Anesthesia Postprocedure Evaluation (Signed)
  Anesthesia Post-op Note  Patient: Alexis Herrera  Procedure(s) Performed: Procedure(s) (LRB): REPAIR OF ACHILLES TENDON   (Left)  Patient Location: PACU  Anesthesia Type: General  Level of Consciousness: awake and alert   Airway and Oxygen Therapy: Patient Spontanous Breathing  Post-op Pain: mild  Post-op Assessment: Post-op Vital signs reviewed, Patient's Cardiovascular Status Stable, Respiratory Function Stable, Patent Airway and No signs of Nausea or vomiting  Last Vitals:  Filed Vitals:   05/31/14 1636  BP:   Pulse: 67  Temp:   Resp: 10    Post-op Vital Signs: stable   Complications: No apparent anesthesia complications

## 2014-05-31 NOTE — Brief Op Note (Signed)
05/31/2014  3:46 PM  PATIENT:  Louanna RawPhilip Zick  29 y.o. male  PRE-OPERATIVE DIAGNOSIS:  achilles tendon rupture left   POST-OPERATIVE DIAGNOSIS:  achilles tendon rupture left   PROCEDURE:  Procedure(s): REPAIR OF ACHILLES TENDON   (Left)  SURGEON:  Surgeon(s) and Role:    * Jene EveryJeffrey Lanett Lasorsa, MD - Primary  PHYSICIAN ASSISTANT:   ASSISTANTS: Bissell   ANESTHESIA:   general  EBL:  Total I/O In: 1000 [I.V.:1000] Out: 20 [Blood:20]  BLOOD ADMINISTERED:none  DRAINS: none   LOCAL MEDICATIONS USED:  MARCAINE     SPECIMEN:  No Specimen  DISPOSITION OF SPECIMEN:  N/A  COUNTS:  YES  TOURNIQUET:   Total Tourniquet Time Documented: Thigh (Left) - 61 minutes Total: Thigh (Left) - 61 minutes   DICTATION: .Other Dictation: Dictation Number   [id number]  PLAN OF CARE: Admit for overnight observation  PATIENT DISPOSITION:  PACU - hemodynamically stable.   Delay start of Pharmacological VTE agent (>24hrs) due to surgical blood loss or risk of bleeding: no

## 2014-05-31 NOTE — Anesthesia Preprocedure Evaluation (Addendum)
Anesthesia Evaluation  Patient identified by MRN, date of birth, ID band Patient awake    Reviewed: Allergy & Precautions, H&P , NPO status , Patient's Chart, lab work & pertinent test results  Airway Mallampati: II  TM Distance: >3 FB Neck ROM: full    Dental no notable dental hx. (+) Teeth Intact, Dental Advisory Given   Pulmonary neg pulmonary ROS, Current Smoker,  breath sounds clear to auscultation  Pulmonary exam normal       Cardiovascular Exercise Tolerance: Good negative cardio ROS Normal cardiovascular examRhythm:regular Rate:Normal     Neuro/Psych negative neurological ROS  negative psych ROS   GI/Hepatic negative GI ROS, Neg liver ROS, GERD-  ,  Endo/Other  negative endocrine ROS  Renal/GU negative Renal ROS  negative genitourinary   Musculoskeletal   Abdominal   Peds  Hematology negative hematology ROS (+)   Anesthesia Other Findings   Reproductive/Obstetrics negative OB ROS                            Anesthesia Physical Anesthesia Plan  ASA: II  Anesthesia Plan: General   Post-op Pain Management:    Induction: Intravenous  Airway Management Planned: Oral ETT  Additional Equipment:   Intra-op Plan:   Post-operative Plan: Extubation in OR  Informed Consent: I have reviewed the patients History and Physical, chart, labs and discussed the procedure including the risks, benefits and alternatives for the proposed anesthesia with the patient or authorized representative who has indicated his/her understanding and acceptance.   Dental Advisory Given  Plan Discussed with: CRNA and Surgeon  Anesthesia Plan Comments:         Anesthesia Quick Evaluation

## 2014-05-31 NOTE — Transfer of Care (Signed)
Immediate Anesthesia Transfer of Care Note  Patient: Alexis RawPhilip Herrera  Procedure(s) Performed: Procedure(s): REPAIR OF ACHILLES TENDON   (Left)  Patient Location: PACU  Anesthesia Type:General  Level of Consciousness: awake, alert , oriented and patient cooperative  Airway & Oxygen Therapy: Patient Spontanous Breathing and Patient connected to face mask oxygen  Post-op Assessment: Report given to RN, Post -op Vital signs reviewed and stable and Patient moving all extremities  Post vital signs: Reviewed and stable  Last Vitals:  Filed Vitals:   05/31/14 1139  BP: 136/85  Pulse: 86  Temp: 36.7 C  Resp: 16    Complications: No apparent anesthesia complications

## 2014-05-31 NOTE — Interval H&P Note (Signed)
History and Physical Interval Note:  05/31/2014 1:27 PM  Alexis Herrera  has presented today for surgery, with the diagnosis of achilles tendon rupture left   The various methods of treatment have been discussed with the patient and family. After consideration of risks, benefits and other options for treatment, the patient has consented to  Procedure(s): REPAIR OF ACHILLES TENDON POSSIBLE PATCH GRAFT ON LEFT  (Left) as a surgical intervention .  The patient's history has been reviewed, patient examined, no change in status, stable for surgery.  I have reviewed the patient's chart and labs.  Questions were answered to the patient's satisfaction.     Emmit Oriley C

## 2014-05-31 NOTE — Discharge Instructions (Signed)
Remain non-weightbearing left leg, use crutches or walker for ambulation Keep cast clean and dry at all times Ice and elevate Left leg toes above the nose 5-6x/day 20 minutes each to reduce swelling Take Aspirin 325mg  twice daily to prevent blood clots Follow up with Dr. Shelle IronBeane in 2 weeks

## 2014-05-31 NOTE — H&P (View-Only) (Signed)
Alexis Herrera is an 29 y.o. male.   Chief Complaint: left ankle pain HPI: The patient is a 29 year old male who presents with ankle complaints. The patient reports left ankle (achilles) symptoms including: pain which began 3 day(s) ago following a specific injury. The injury occurred while the patient was playing basketball. The patient describes their symptoms as mild (to severe). Current treatment includes posterior splint, crutches (non-weight bearing), nonsteroidal anti-inflammatory drugs (Naproxen) and opioid analgesics (Percocet). Prior to being seen today the patient was previously evaluated in the emergency room. Previous work-up for this problem has included ankle MRI. Past treatment for this problem has included posterior splint, crutches (non-weight bearing), nonsteroidal anti-inflammatory drugs (Naproxen) and opioid analgesics. He has had the other side repaired two-three years ago, was the same etiology.  Past Medical History  Diagnosis Date  . Shoulder dislocation, recurrent     Past Surgical History  Procedure Laterality Date  . Shoulder surgery      multiple surgeries on the left and surgery on the right     No family history on file. Social History:  reports that he has been smoking Cigarettes.  He has been smoking about 1.00 pack per day. He does not have any smokeless tobacco history on file. He reports that he drinks alcohol. His drug history is not on file.  Allergies: No Known Allergies   (Not in a hospital admission)  No results found for this or any previous visit (from the past 48 hour(s)). No results found.  Review of Systems  Constitutional: Negative.   HENT: Negative.   Eyes: Negative.   Respiratory: Negative.   Cardiovascular: Negative.   Gastrointestinal: Negative.   Genitourinary: Negative.   Musculoskeletal: Positive for joint pain.  Skin: Negative.   Neurological: Negative.   Psychiatric/Behavioral: Negative.     There were no vitals taken  for this visit. Physical Exam  Constitutional: He is oriented to person, place, and time. He appears well-developed and well-nourished.  HENT:  Head: Normocephalic and atraumatic.  Eyes: Conjunctivae and EOM are normal. Pupils are equal, round, and reactive to light.  Neck: Normal range of motion. Neck supple.  Cardiovascular: Normal rate and regular rhythm.   Respiratory: Effort normal and breath sounds normal.  GI: Soft. Bowel sounds are normal.  Musculoskeletal:  On exam, he has got a gap in the Achilles tendon, near the proximal aspect of the tendon. Not at the insertion. There is no evidence of DVT. He is neurovascularly intact. Knee exam and hip exam is unremarkable  Neurological: He is alert and oriented to person, place, and time. He has normal reflexes.  Skin: Skin is warm and dry.  Psychiatric: He has a normal mood and affect.    His outside MR demonstrates complete tear of the Achilles tendon, 4 cm above the calcaneus. Gap in the tendon is approximately 4 cm.  X-rays demonstrate no fracture.  Assessment/Plan Acute left achilles tendon rupture  We discussed options conservative versus surgical and he agreed to proceeding with surgical that may require a patch graft. We discussed risks and benefits, bleeding, infection, recurrent tear, suboptimal range of motion, DVT, PE, neurologic damage and the time to union. This may take a considerable time to heal. He does not have any insurance. [facility]. In the interim, aspirin for DVT prophylaxis, tobacco cessation indicating an affect on healing.  Plan repair left achilles tendon, possible patch graft  BISSELL, JACLYN M. PA-C for Dr.  Beane 05/28/2014, 8:05 AM

## 2014-05-31 NOTE — Anesthesia Procedure Notes (Signed)
Procedure Name: Intubation Date/Time: 05/31/2014 1:50 PM Performed by: Durward ParcelFLYNN-COOK, Zaynab Chipman A Pre-anesthesia Checklist: Patient identified, Emergency Drugs available, Suction available, Patient being monitored and Timeout performed Patient Re-evaluated:Patient Re-evaluated prior to inductionOxygen Delivery Method: Circle system utilized Preoxygenation: Pre-oxygenation with 100% oxygen Intubation Type: IV induction Ventilation: Mask ventilation without difficulty Laryngoscope Size: Mac and 4 Grade View: Grade II Tube size: 8.0 mm Number of attempts: 1 Airway Equipment and Method: Stylet Placement Confirmation: ETT inserted through vocal cords under direct vision,  positive ETCO2 and breath sounds checked- equal and bilateral Secured at: 23 cm Tube secured with: Tape Dental Injury: Teeth and Oropharynx as per pre-operative assessment

## 2014-06-01 NOTE — Op Note (Signed)
NAMESUNIL, [name]              ACCOUNT NO.:  [id number]  MEDICAL RECORD NO.:  [id number]  LOCATION:  1613                         FACILITY:  [facility]  PHYSICIAN:  Alexis Herrera, M.D.    DATE OF BIRTH:  1985/02/24  DATE OF PROCEDURE:  05/31/2014 DATE OF DISCHARGE:                              OPERATIVE REPORT   PREOPERATIVE DIAGNOSIS:  Left Achilles tendon tear.  POSTOPERATIVE DIAGNOSIS:  Left Achilles tendon tear.  PROCEDURES PERFORMED: 1. Repair of left Achilles tendon. 2. Exam under anesthesia, left ankle and left foot. 3. Open Achilles tendon repair utilizing FiberWire. 4. Placement of short-leg cast.  ANESTHESIA:  General.  ASSISTANT:  Lanna Poche, PA.  HISTORY:  A 29 year old, who ruptured his tendon playing basketball, had an MRI, which indicated tear over 5 cm above the calcaneus, it was separated by 4 cm.  Some chronicity, acute on chronic was noted. Thought he may have an ankle insufficiency as well.  He was then splinted in a cast.  No evidence of DVT into the toes.  He was neurovascularly intact.  He was indicated for repair of the tendon given the gap noted on the MRI.  Risks and benefits were discussed including bleeding, infection, damage to neurovascular structures, DVT, PE, anesthetic complications, re-tear, need for revision, etc.  TECHNIQUE:  With the patient in a supine position, after the induction of adequate general anesthesia, 2 g of Kefzol, examined the left ankle and foot.  He did not have negative anterior drawer, perhaps some slight increased inversion slightly in comparison to the contralateral side. Knee exam was unremarkable.  He was placed in the prone position.  We prepped and draped the left lower extremity in usual sterile fashion. Elevated leg and inflated the thigh tourniquet to 250 mmHg.  The tendon tear was identified, made a lazy-S incision over the tearing proximally. We went from medial to just over the tendon crossing at the  tear.  We meticulously incised through the skin only and bluntly dissected the subcutaneous tissue identifying the sural nerve medially and then proximally and protected it throughout the case.  We followed it medially and then proximally.  The paratenon was identified, I divided it with a #15 blade in the center.  We meticulously developed an envelope to identify the tendon, tear was noted, mid-substance.  We copiously irrigated the wound, did some debridement, mobilized the tendon pulling the ends together with an Allis with the toes plantar flexed, this occurred without difficulty.  We then used a FiberWire with a baseball-type stitch and captured the proximal tendon and the distal tendon.  Advanced both to meet and closed the gap.  We took a portion of the tendon and rolled it into the repair again protecting and then isolating the sural nerve throughout the case.  Good surgical knot was noted, and they were advanced with the ends meeting and good coverage was noted.  Care was taken to preserve the paratenon.  This was then copiously irrigated.  We oversewed it with 0-Vicryl interrupted figure- of-eight sutures.  We then meticulously repaired the paratenon proximally and distally again isolating and protecting the sural nerve. Again, we did so on the subcu with 2-0 protecting  the sural nerve.  With the foot plantar flexed, we were able to bring the foot to neutral with still good approximation with no changes in approximation of the tendon. After 2-0 closure, closed the skin with staples and small stapler. Tourniquet had been deflated at 61 minutes and there was adequate revascularization of the lower extremity appreciated.  Adaptic was placed in slight plantar flexion of the foot, placed a short leg cast after applying a sterile dressing, Webril well padded in slight plantar flexion.  The patient was then placed supine on the hospital bed, extubated without difficulty, and  transported to the recovery room in a satisfactory condition.  The patient tolerated the procedure well.  No complications.  Assistant, Lanna PocheJacqueline Bissell, PA was utilized throughout the case for holding of the extremity, exposure, holding of casting, etc., and minimal blood loss.     Alexis EveryJeffrey Corneluis Herrera, M.D.     Cordelia PenJB/MEDQ  D:  05/31/2014  T:  06/01/2014  Job:  [id number]

## 2014-06-01 NOTE — Progress Notes (Signed)
   Subjective: 1 Day Post-Op Procedure(s) (LRB): REPAIR OF ACHILLES TENDON   (Left) Patient reports pain as moderate.   Patient seen in rounds with Dr. Darrelyn HillockGioffre . Patient is well, and has had no acute complaints or problems other than pain in the left foot. No issues overnight. No SOB or chest pain. Reports desire to go home today.    Objective: Vital signs in last 24 hours: Temp:  [97.4 F (36.3 C)-99.1 F (37.3 C)] 99.1 F (37.3 C) (05/21 [id number]) Pulse Rate:  [54-107] 54 (05/21 0632) Resp:  [10-20] 14 (05/21 0632) BP: (109-140)/(58-85) 120/67 mmHg (05/21 0632) SpO2:  [98 %-100 %] 100 % (05/21 [id number]) Weight:  [90.719 kg (200 lb)] 90.719 kg (200 lb) (05/20 1139)  Intake/Output from previous day:  Intake/Output Summary (Last 24 hours) at 06/01/14 0805 Last data filed at 06/01/14 0631  Gross per 24 hour  Intake 3198.75 ml  Output   1645 ml  Net 1553.75 ml     Labs:  Recent Labs  05/30/14 1100  HGB 15.7    Recent Labs  05/30/14 1100  WBC 10.0  RBC 5.20  HCT 46.1  PLT 316    EXAM General - Patient is Alert and Oriented Extremity - Neurologically intact Sensation intact distally Compartment soft Dressing/Incision - clean, dry, no drainage Motor Function - intact, moving toes well on exam.   Past Medical History  Diagnosis Date  . Shoulder dislocation, recurrent   . Rupture Achilles tendon     LEFT  . Eczema   . GERD (gastroesophageal reflux disease)     OTC MEDS PRN    Assessment/Plan: 1 Day Post-Op Procedure(s) (LRB): REPAIR OF ACHILLES TENDON   (Left) Principal Problem:   Rupture of left Achilles tendon Active Problems:   Achilles tendon tear  Estimated body mass index is 28.7 kg/(m^2) as calculated from the following:   Height as of this encounter: 5\' 10"  (1.778 m).   Weight as of this encounter: 90.719 kg (200 lb). Advance diet Up with therapy D/C IV fluids Discharge home  DVT Prophylaxis - Aspirin Weight-Bearing as tolerated to NWB left  leg  Will DC home today. Will have dose of lovenox today and begin aspirin 325mg  twice daily for DVT prophylaxis.   Dimitri PedAmber Ixchel Duck, PA-C Orthopaedic Surgery 06/01/2014, 8:05 AM

## 2014-06-01 NOTE — Evaluation (Signed)
Physical Therapy Evaluation Patient Details Name: Alexis Herrera MRN: [id number] DOB: [birth year] Today's Date: 06/01/2014   History of Present Illness  s/p L achilles tendon repair;     Clinical Impression  Patient evaluated by Physical Therapy with no further acute PT needs identified. All education has been completed and the patient has no further questions.  See below for any follow-up Physial Therapy or equipment needs. PT is signing off. Thank you for this referral.     Follow Up Recommendations No PT follow up (OPPT per MD after f/u)    Equipment Recommendations  None recommended by PT    Recommendations for Other Services       Precautions / Restrictions Precautions Precautions: Fall Restrictions Weight Bearing Restrictions: Yes LLE Weight Bearing: Non weight bearing      Mobility  Bed Mobility Overal bed mobility: Needs Assistance Bed Mobility: Supine to Sit     Supine to sit: Modified independent (Device/Increase time)        Transfers Overall transfer level: Needs assistance Equipment used: Crutches Transfers: Sit to/from Stand Sit to Stand: Modified independent (Device/Increase time)         General transfer comment: reviewed safe tecnique with crutches  Ambulation/Gait Ambulation/Gait assistance: Supervision;Min guard Ambulation Distance (Feet): 100 Feet Assistive device: Crutches       General Gait Details: initial cues for safety  Stairs            Wheelchair Mobility    Modified Rankin (Stroke Patients Only)       Balance Overall balance assessment: Needs assistance Sitting-balance support: Feet supported;No upper extremity supported Sitting balance-Leahy Scale: Good Sitting balance - Comments: not tested further     Standing balance-Leahy Scale: Fair Standing balance comment: pt able to maintain static stand briefly without UE support, close supervision for safety, NWB on LLE                              Pertinent Vitals/Pain Pain Assessment: No/denies pain    Home Living Family/patient expects to be discharged to:: Private residence Living Arrangements: Spouse/significant other   Type of Home: House Home Access: Level entry     Home Layout: One level Home Equipment: Crutches      Prior Function Level of Independence: Independent               Hand Dominance        Extremity/Trunk Assessment   Upper Extremity Assessment: Overall WFL for tasks assessed           Lower Extremity Assessment: LLE deficits/detail   LLE Deficits / Details: lower leg splint     Communication   Communication: No difficulties  Cognition Arousal/Alertness: Awake/alert Behavior During Therapy: WFL for tasks assessed/performed Overall Cognitive Status: Within Functional Limits for tasks assessed                      General Comments      Exercises        Assessment/Plan    PT Assessment All further PT needs can be met in the next venue of care  PT Diagnosis Difficulty walking   PT Problem List    PT Treatment Interventions     PT Goals (Current goals can be found in the Care Plan section) Acute Rehab PT Goals Patient Stated Goal: home and soon PT Goal Formulation: All assessment and education complete, DC therapy    Frequency  Barriers to discharge        Co-evaluation               End of Session Equipment Utilized During Treatment: Gait belt Activity Tolerance: Patient tolerated treatment well;No increased pain Patient left: in chair;with call bell/phone within reach;with family/visitor present;with nursing/sitter in room Nurse Communication: Mobility status    Functional Assessment Tool Used: clinical judgement Functional Limitation: Mobility: Walking and moving around Mobility: Walking and Moving Around Current Status (H6579): At least 1 percent but less than 20 percent impaired, limited or restricted Mobility: Walking and Moving  Around Goal Status [phone]): At least 1 percent but less than 20 percent impaired, limited or restricted Mobility: Walking and Moving Around Discharge Status [phone]): At least 1 percent but less than 20 percent impaired, limited or restricted    Time: [phone] PT Time Calculation (min) (ACUTE ONLY): 35 min   Charges:   PT Evaluation $Initial PT Evaluation Tier I: 1 Procedure PT Treatments $Gait Training: 8-22 mins   PT G Codes:   PT G-Codes **NOT FOR INPATIENT CLASS** Functional Assessment Tool Used: clinical judgement Functional Limitation: Mobility: Walking and moving around Mobility: Walking and Moving Around Current Status (O4599): At least 1 percent but less than 20 percent impaired, limited or restricted Mobility: Walking and Moving Around Goal Status [phone]): At least 1 percent but less than 20 percent impaired, limited or restricted Mobility: Walking and Moving Around Discharge Status [phone]): At least 1 percent but less than 20 percent impaired, limited or restricted    [facility] 06/01/2014, 9:42 AM

## 2014-06-02 NOTE — Discharge Summary (Signed)
Patient ID: Alexis Herrera MRN: [id number] DOB/AGE: 31/08/1985 28 y.o.  Admit date: 05/31/2014 Discharge date: 06/02/2014  Admission Diagnoses:  Principal Problem:   Rupture of left Achilles tendon Active Problems:   Achilles tendon tear   Discharge Diagnoses:  Same  Past Medical History  Diagnosis Date  . Shoulder dislocation, recurrent   . Rupture Achilles tendon     LEFT  . Eczema   . GERD (gastroesophageal reflux disease)     OTC MEDS PRN    Surgeries: Procedure(s): REPAIR OF ACHILLES TENDON   on 05/31/2014   Consultants:    Discharged Condition: Improved  Hospital Course: Alexis Herrera is an 29 y.o. male who was admitted 05/31/2014 for operative treatment ofRupture of left Achilles tendon. Patient has severe unremitting pain that affects sleep, daily activities, and work/hobbies. After pre-op clearance the patient was taken to the operating room on 05/31/2014 and underwent  Procedure(s): REPAIR OF ACHILLES TENDON  .    Patient was given perioperative antibiotics:  Anti-infectives    Start     Dose/Rate Route Frequency Ordered Stop   05/31/14 2000  ceFAZolin (ANCEF) IVPB 2 g/50 mL premix     2 g 100 mL/hr over 30 Minutes Intravenous Every 6 hours 05/31/14 1812 06/01/14 0149   05/31/14 1431  polymyxin B 500,000 Units, bacitracin 50,000 Units in sodium chloride irrigation 0.9 % 500 mL irrigation  Status:  Discontinued       As needed 05/31/14 1432 05/31/14 1550   05/31/14 1145  ceFAZolin (ANCEF) IVPB 2 g/50 mL premix     2 g 100 mL/hr over 30 Minutes Intravenous On call to O.R. 05/31/14 1138 05/31/14 1342       Patient was given sequential compression devices, early ambulation, and chemoprophylaxis to prevent DVT.  Patient benefited maximally from hospital stay and there were no complications.    Recent vital signs: No data found.    Recent laboratory studies: No results for input(s): WBC, HGB, HCT, PLT, NA, K, CL, CO2, BUN, CREATININE, GLUCOSE, INR, CALCIUM in  the last 72 hours.  Invalid input(s): PT, 2   Discharge Medications:     Medication List    TAKE these medications        docusate sodium 100 MG capsule  Commonly known as:  COLACE  Take 1 capsule (100 mg total) by mouth 2 (two) times daily.     methocarbamol 500 MG tablet  Commonly known as:  ROBAXIN  Take 1 tablet (500 mg total) by mouth 4 (four) times daily.     naproxen 500 MG tablet  Commonly known as:  NAPROSYN  Take 1 tablet (500 mg total) by mouth 2 (two) times daily.     oxyCODONE-acetaminophen 5-325 MG per tablet  Commonly known as:  ROXICET  Take 1-2 tablets by mouth every 4 (four) hours as needed for severe pain.        Diagnostic Studies: Mr Ankle Left  Wo Contrast  05/20/2014   CLINICAL DATA:  Acute onset of pain while playing basketball today. Previous tear of the right Achilles tendon.  EXAM: MRI OF THE LEFT ANKLE WITHOUT CONTRAST  TECHNIQUE: Multiplanar, multisequence MR imaging of the ankle was performed. No intravenous contrast was administered.  COMPARISON:  None.  FINDINGS: TENDONS  Peroneal: Normal.  Posteromedial: Normal.  Anterior: Normal.  Achilles: Complete disruption of the Achilles tendon 4 cm above the calcaneus. The gap in the tendon is approximately 4 cm. The distal tendon is hypertrophied and degenerated. There is hemorrhage  into the adjacent soft tissues.  Plantar Fascia: Normal.  LIGAMENTS  Lateral: There is no visible intact anterior talofibular ligament or calcaneofibular ligament, probably representing chronic tears. The posterior talofibular ligament is intact.  Medial: Normal.  CARTILAGE  Ankle Joint: Normal.  Subtalar Joints/Sinus Tarsi: Normal.  Bones: No significant abnormality.  IMPRESSION: 1. 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 2. No visible anterior talofibular ligament or calcaneofibular ligament. Does the patient have ligamentous laxity laterally? This is not felt to be acute.    Electronically Signed   By: Francene Boyers M.D.   On: 05/20/2014 18:35    Disposition: 01-Home or Self Care      Discharge Instructions    Call MD / Call 911    Complete by:  As directed   If you experience chest pain or shortness of breath, CALL 911 and be transported to the hospital emergency room.  If you develope a fever above 101 F, pus (white drainage) or increased drainage or redness at the wound, or calf pain, call your surgeon's office.     Constipation Prevention    Complete by:  As directed   Drink plenty of fluids.  Prune juice may be helpful.  You may use a stool softener, such as Colace (over the counter) 100 mg twice a day.  Use MiraLax (over the counter) for constipation as needed.     Diet general    Complete by:  As directed      Increase activity slowly as tolerated    Complete by:  As directed            Follow-up Information    Follow up with BEANE,JEFFREY C, MD In 2 weeks.   Specialty:  Orthopedic Surgery   Why:  For suture removal   Contact information:   [address] [place] [address] [phone]        Signed: Dorothy Spark. 06/02/2014, 8:49 PM

## 2014-06-03 ENCOUNTER — Encounter (HOSPITAL_COMMUNITY): Payer: Self-pay | Admitting: Specialist

## 2014-06-03 NOTE — Progress Notes (Signed)
Chart reviewed, "outpatient in bed ordered" confirmed.  No review required

## 2017-11-21 ENCOUNTER — Other Ambulatory Visit: Payer: Self-pay

## 2017-11-21 ENCOUNTER — Encounter (HOSPITAL_BASED_OUTPATIENT_CLINIC_OR_DEPARTMENT_OTHER): Payer: Self-pay | Admitting: *Deleted

## 2017-11-21 ENCOUNTER — Emergency Department (HOSPITAL_BASED_OUTPATIENT_CLINIC_OR_DEPARTMENT_OTHER): Payer: 59

## 2017-11-21 ENCOUNTER — Emergency Department (HOSPITAL_BASED_OUTPATIENT_CLINIC_OR_DEPARTMENT_OTHER)
Admission: EM | Admit: 2017-11-21 | Discharge: 2017-11-21 | Disposition: A | Discharge status: Home / Self Care | Payer: 59 | Attending: Emergency Medicine | Admitting: Emergency Medicine

## 2017-11-21 DIAGNOSIS — I10 Essential (primary) hypertension: Secondary | ICD-10-CM | POA: Diagnosis not present

## 2017-11-21 DIAGNOSIS — F17210 Nicotine dependence, cigarettes, uncomplicated: Secondary | ICD-10-CM | POA: Insufficient documentation

## 2017-11-21 DIAGNOSIS — R079 Chest pain, unspecified: Secondary | ICD-10-CM | POA: Diagnosis present

## 2017-11-21 DIAGNOSIS — R0789 Other chest pain: Secondary | ICD-10-CM | POA: Insufficient documentation

## 2017-11-21 LAB — CBC WITH DIFFERENTIAL/PLATELET
Abs Immature Granulocytes: 0.02 10*3/uL (ref 0.00–0.07)
BASOS ABS: 0 10*3/uL (ref 0.0–0.1)
BASOS PCT: 0 %
Eosinophils Absolute: 0.2 10*3/uL (ref 0.0–0.5)
Eosinophils Relative: 2 %
HCT: 42.1 % (ref 39.0–52.0)
Hemoglobin: 14 g/dL (ref 13.0–17.0)
IMMATURE GRANULOCYTES: 0 %
Lymphocytes Relative: 23 %
Lymphs Abs: 2.3 10*3/uL (ref 0.7–4.0)
MCH: 30.9 pg (ref 26.0–34.0)
MCHC: 33.3 g/dL (ref 30.0–36.0)
MCV: 92.9 fL (ref 80.0–100.0)
Monocytes Absolute: 0.9 10*3/uL (ref 0.1–1.0)
Monocytes Relative: 9 %
NEUTROS PCT: 66 %
Neutro Abs: 6.5 10*3/uL (ref 1.7–7.7)
PLATELETS: 285 10*3/uL (ref 150–400)
RBC: 4.53 MIL/uL (ref 4.22–5.81)
RDW: 14.8 % (ref 11.5–15.5)
WBC: 9.9 10*3/uL (ref 4.0–10.5)
nRBC: 0 % (ref 0.0–0.2)

## 2017-11-21 LAB — LIPASE, BLOOD: LIPASE: 33 U/L (ref 11–51)

## 2017-11-21 LAB — COMPREHENSIVE METABOLIC PANEL
ALBUMIN: 4.1 g/dL (ref 3.5–5.0)
ALT: 23 U/L (ref 0–44)
AST: 60 U/L — AB (ref 15–41)
Alkaline Phosphatase: 83 U/L (ref 38–126)
Anion gap: 13 (ref 5–15)
BUN: 12 mg/dL (ref 6–20)
CHLORIDE: 102 mmol/L (ref 98–111)
CO2: 27 mmol/L (ref 22–32)
Calcium: 9.2 mg/dL (ref 8.9–10.3)
Creatinine, Ser: 1.01 mg/dL (ref 0.61–1.24)
GFR calc Af Amer: >60 mL/min (ref >60)
GFR calc non Af Amer: >60 mL/min (ref >60)
Glucose, Bld: 104 mg/dL — ABNORMAL HIGH (ref 70–99)
POTASSIUM: 3.3 mmol/L — AB (ref 3.5–5.1)
SODIUM: 142 mmol/L (ref 135–145)
Total Bilirubin: 0.5 mg/dL (ref 0.3–1.2)
Total Protein: 7.1 g/dL (ref 6.5–8.1)

## 2017-11-21 LAB — TROPONIN I: Troponin I: <0.03 ng/mL (ref <0.03)

## 2017-11-21 MED ORDER — KETOROLAC TROMETHAMINE 30 MG/ML IJ SOLN
INTRAMUSCULAR | Status: AC
  Administered 2017-11-21: 30 mg
  Filled 2017-11-21: qty 1

## 2017-11-21 MED ORDER — KETOROLAC TROMETHAMINE 30 MG/ML IJ SOLN: 30.0000 mg | Freq: Once | INTRAMUSCULAR | Status: DC

## 2017-11-21 NOTE — ED Provider Notes (Signed)
[facility] EMERGENCY DEPARTMENT Provider Note   CSN: [id number] Arrival date & time: 11/21/17  0429     History   Chief Complaint Chief Complaint  Patient presents with  . chest pain    HPI Alexis Herrera is a 32 y.o. male.  HPI  This is a 32 year old male with a history of reflux and smoking who presents with chest pain.  Patient reports that he has had increasing reflux symptoms over the last month.  He reports some general burning chest discomfort that is associated with eating.  However, he is also developed some left-sided chest pain that radiates into his left arm.  He states that the pain improves when "my shoulder pops."  He describes the pain as pinching. Denies any injury.  Reports some sweating but no shortness of breath.  Currently rates his pain at 2 out of 10.  Has had some nausea.  No vomiting.  Pain does not seem to be worse with exertion.  Denies any fevers or recent coughs.  He has taken Pepcid which sometimes relieves his pain.  Past Medical History:  Diagnosis Date  . Eczema   . GERD (gastroesophageal reflux disease)    OTC MEDS PRN  . Rupture Achilles tendon    LEFT  . Shoulder dislocation, recurrent     Patient Active Problem List   Diagnosis Date Noted  . Rupture of left Achilles tendon 05/31/2014  . Achilles tendon tear 05/31/2014    Past Surgical History:  Procedure Laterality Date  . ACHILLES TENDON REPAIR     RIGHT  . ACHILLES TENDON SURGERY Left 05/31/2014   Procedure: REPAIR OF ACHILLES TENDON  ;  Surgeon: Jene Every, MD;  Location: [facility]S;  Service: Orthopedics;  Laterality: Left;  . SHOULDER SURGERY     multiple surgeries on the left and surgery on the right   . SHOULDER SURGERY     RT        Home Medications    Prior to Admission medications   Medication Sig Start Date End Date Taking? Authorizing Provider  docusate sodium (COLACE) 100 MG capsule Take 1 capsule (100 mg total) by mouth 2 (two) times daily. 05/31/14    Dorothy Spark, PA-C  methocarbamol (ROBAXIN) 500 MG tablet Take 1 tablet (500 mg total) by mouth 4 (four) times daily. 05/31/14   Dorothy Spark, PA-C  naproxen (NAPROSYN) 500 MG tablet Take 1 tablet (500 mg total) by mouth 2 (two) times daily. 05/20/14   Santiago Glad, PA-C  oxyCODONE-acetaminophen (ROXICET) 5-325 MG per tablet Take 1-2 tablets by mouth every 4 (four) hours as needed for severe pain. 05/31/14   Dorothy Spark, PA-C    Family History No family history on file.  Social History Social History   Tobacco Use  . Smoking status: Current Every Day Smoker    Packs/day: 1.00    Types: Cigarettes  . Smokeless tobacco: Current User  Substance Use Topics  . Alcohol use: Yes    Comment: OCCASIONAL  . Drug use: No     Allergies   Patient has no known allergies.   Review of Systems Review of Systems  Constitutional: Negative for fever.  Respiratory: Negative for cough and shortness of breath.   Cardiovascular: Positive for chest pain. Negative for palpitations and leg swelling.  Gastrointestinal: Positive for nausea. Negative for abdominal pain and vomiting.  Genitourinary: Negative for dysuria.  Neurological: Negative for weakness.  All other systems reviewed and are negative.  Physical Exam Updated Vital Signs BP (!) 194/110 (BP Location: Right Arm)   Pulse (!) 101   Temp 98.2 F (36.8 C) (Oral)   Resp 18   Ht 1.803 m (5\' 11" )   Wt 104.3 kg   SpO2 99%   BMI 32.08 kg/m   Physical Exam  Constitutional: He is oriented to person, place, and time. He appears well-developed and well-nourished. No distress.  HENT:  Head: Normocephalic and atraumatic.  Eyes: Pupils are equal, round, and reactive to light.  Neck: Neck supple.  Cardiovascular: Normal rate, regular rhythm and normal heart sounds.  No murmur heard. Pulmonary/Chest: Effort normal and breath sounds normal. No respiratory distress. He has no wheezes. He exhibits tenderness.  Left chest  wall tenderness to palpation, no crepitus  Abdominal: Soft. Bowel sounds are normal. There is no tenderness. There is no rebound.  Musculoskeletal: He exhibits no edema or tenderness.  Lymphadenopathy:    He has no cervical adenopathy.  Neurological: He is alert and oriented to person, place, and time.  Skin: Skin is warm and dry.  Psychiatric: He has a normal mood and affect.  Nursing note and vitals reviewed.    ED Treatments / Results  Labs (all labs ordered are listed, but only abnormal results are displayed) Labs Reviewed  COMPREHENSIVE METABOLIC PANEL - Abnormal; Notable for the following components:      Result Value   Potassium 3.3 (*)    Glucose, Bld 104 (*)    AST 60 (*)    All other components within normal limits  CBC WITH DIFFERENTIAL/PLATELET  LIPASE, BLOOD  TROPONIN I    EKG None  Radiology Dg Chest 2 View  Result Date: 11/21/2017 CLINICAL DATA:  Initial evaluation for acute centralized chest pain. EXAM: CHEST - 2 VIEW COMPARISON:  None. FINDINGS: The cardiac and mediastinal silhouettes are within normal limits. The lungs are normally inflated. No airspace consolidation, pleural effusion, or pulmonary edema is identified. There is no pneumothorax. No acute osseous abnormality. Sequelae of prior ORIF partially visualized at the left shoulder. IMPRESSION: No active cardiopulmonary disease. Electronically Signed   By: Rise Mu M.D.   On: 11/21/2017 05:35    Procedures Procedures (including critical care time)  Medications Ordered in ED Medications  ketorolac (TORADOL) 30 MG/ML injection 30 mg (30 mg Intravenous Not Given 11/21/17 0515)  ketorolac (TORADOL) 30 MG/ML injection (30 mg  Given 11/21/17 1610)     Initial Impression / Assessment and Plan / ED Course  I have reviewed the triage vital signs and the nursing notes.  Pertinent labs & imaging results that were available during my care of the patient were reviewed by me and considered in my  medical decision making (see chart for details).     Patient presents with pain over the left chest.  Is overall nontoxic-appearing vital signs are notable initially for elevated blood pressure 194/110.  Patient reports normal blood pressures are in the 130-140 range for him.  He has some reproducible pain on exam and also reports reflux symptoms.  No exertional symptoms.  EKG is nonischemic and without evidence of arrhythmia.  He is low risk for PE.  Patient was given Toradol with some improvement of pain.  Troponin is negative.  Doubt ACS.  Chest x-ray shows no evidence of pneumothorax or pneumonia.  Work-up is largely reassuring.  Recommend supportive measures and follow-up if not improving.  Patient stated understanding.  After history, exam, and medical workup I feel the patient has been  appropriately medically screened and is safe for discharge home. Pertinent diagnoses were discussed with the patient. Patient was given return precautions.   Final Clinical Impressions(s) / ED Diagnoses   Final diagnoses:  Atypical chest pain  Essential hypertension    ED Discharge Orders    None       Shon Baton, MD 11/21/17 [phone]

## 2017-11-21 NOTE — ED Notes (Signed)
MD with pt  

## 2017-11-21 NOTE — ED Notes (Signed)
Returned from xray

## 2017-11-21 NOTE — Discharge Instructions (Addendum)
You were seen today for chest pain.  Your work-up is reassuring including your heart tests.  This could be related to chest wall pain or your known reflux.  Continue medications as prescribed.  Follow-up with cardiology.  You need to follow-up with primary physician for recheck of your blood pressure.  If it remains persistently elevated you should be started on medications.

## 2017-11-21 NOTE — ED Triage Notes (Addendum)
Pt c/o of reflux type symptoms for the past month. Has taken Prilosec. Concerned that this pain could be his heart. States his mother had similar symptoms prior to her stent placement. C/o nausea today. Denies vomiting. Denies any sob. Describes pain to his left chest as sharp and intermittent.

## 2020-07-29 IMAGING — CR DG CHEST 2V
2 series · 2 of 2 positions shown · non-contrast
Comparison: None.

CLINICAL DATA: Initial evaluation for acute centralized chest pain.

EXAM:
CHEST - 2 VIEW

[w chest pa]
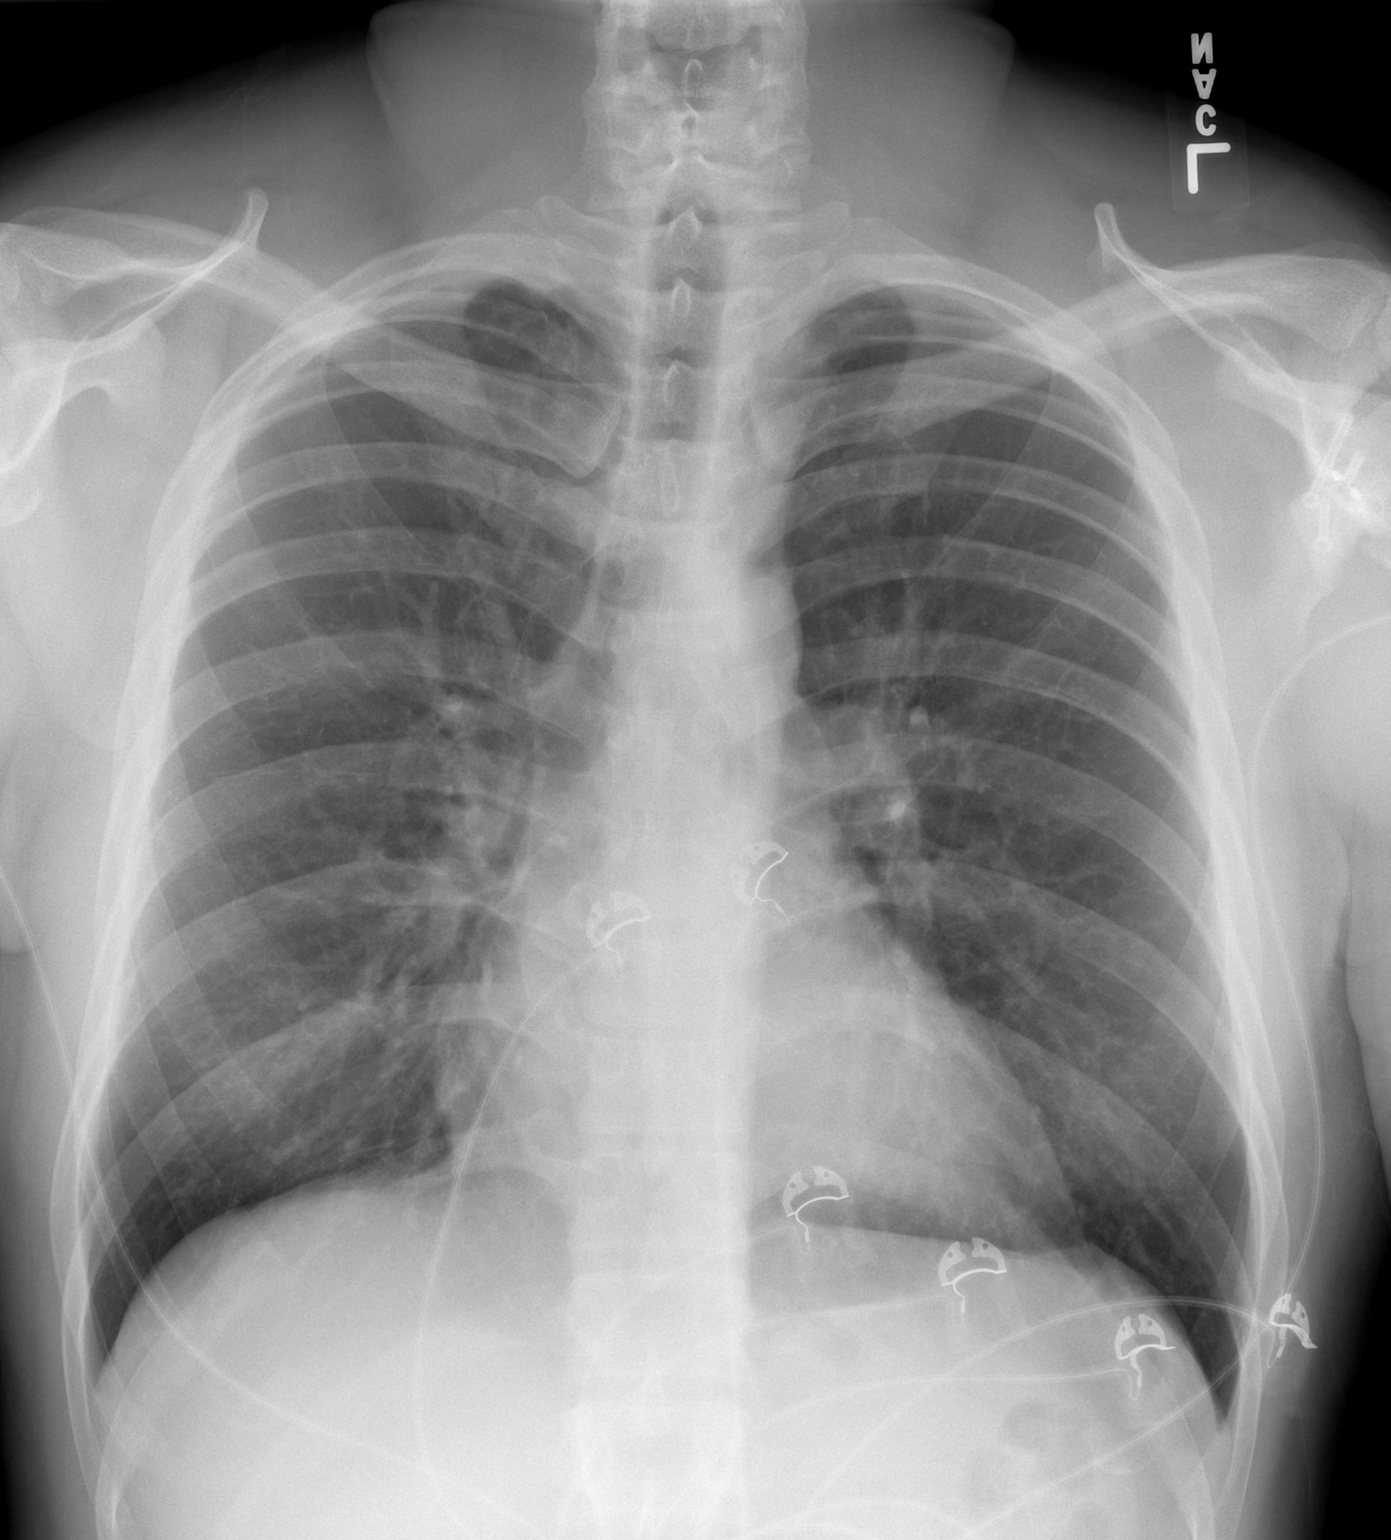

[w chest lat]
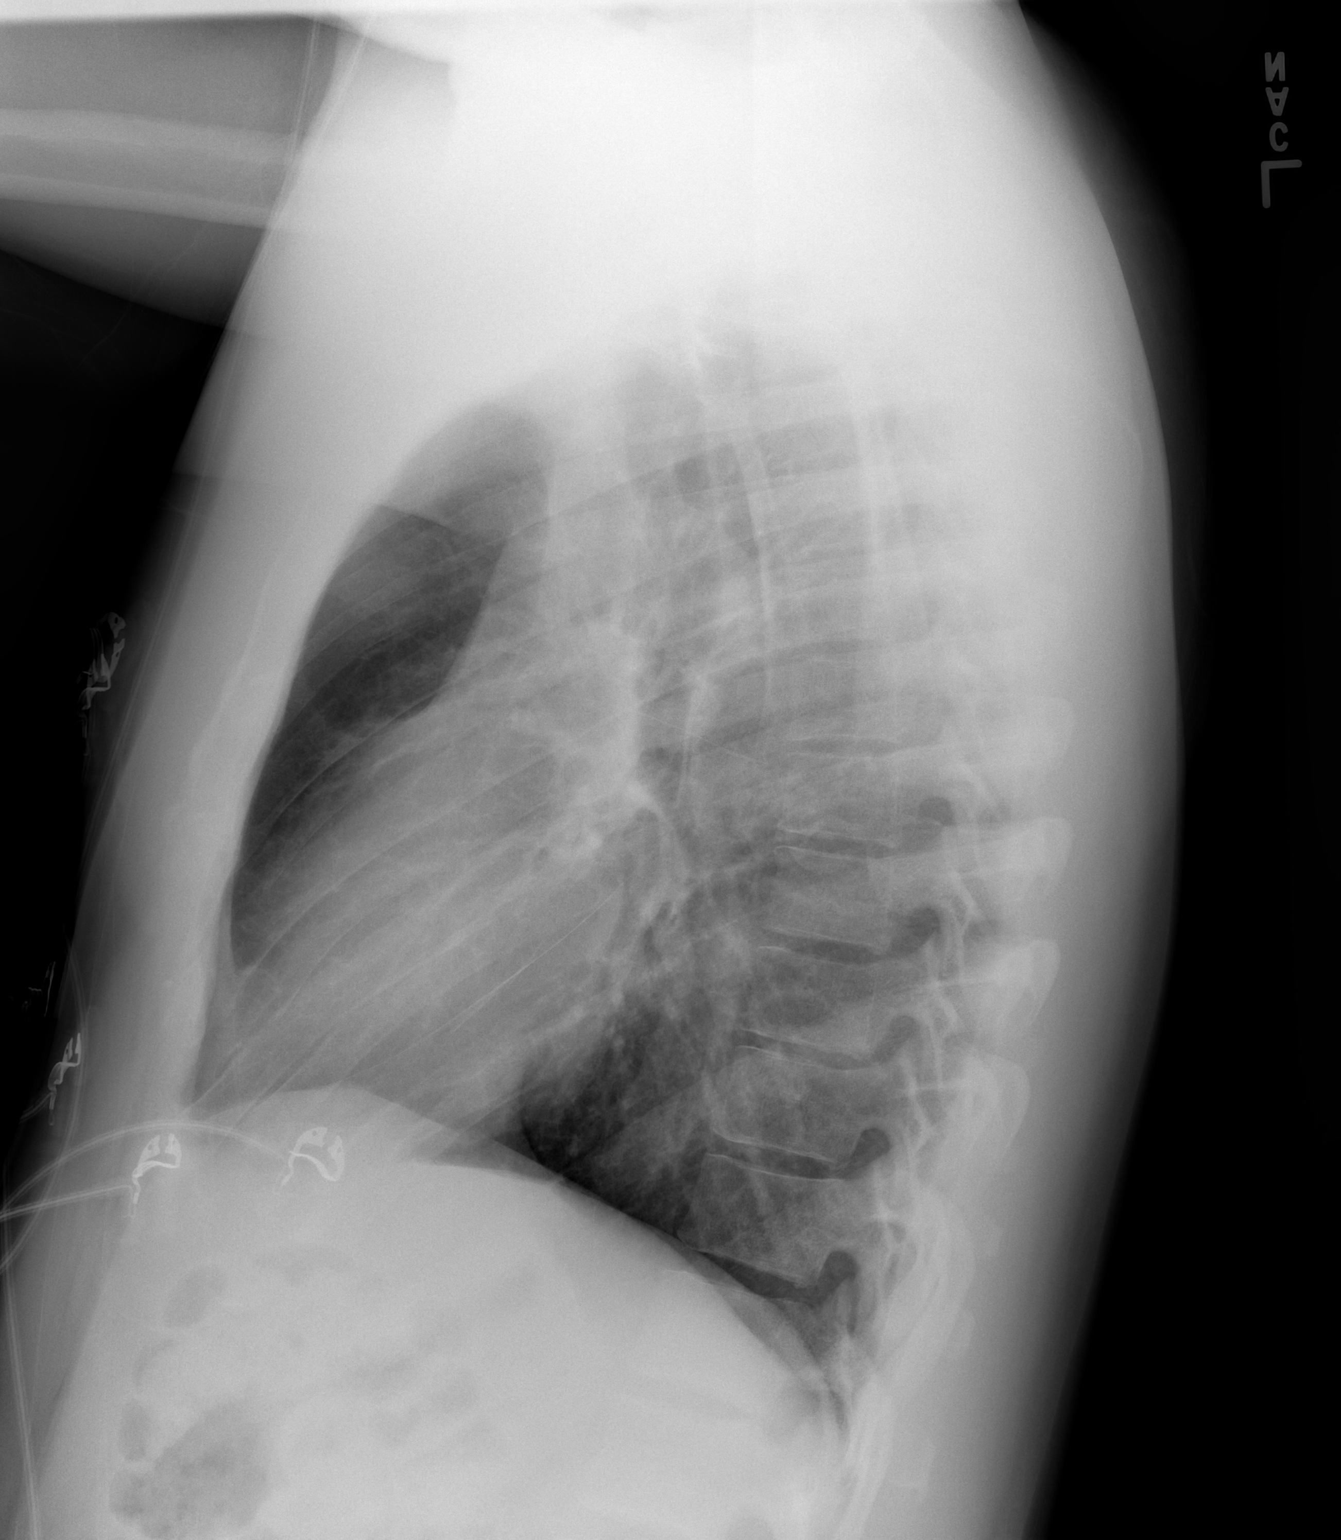

[2 of 2 positions shown; findings below may reference images not displayed]

FINDINGS: The cardiac and mediastinal silhouettes are within normal limits.

The lungs are normally inflated. No airspace consolidation, pleural
effusion, or pulmonary edema is identified. There is no
pneumothorax.

No acute osseous abnormality. Sequelae of prior ORIF partially
visualized at the left shoulder.
IMPRESSION: No active cardiopulmonary disease.
